# Patient Record
Sex: Female | Born: 1960 | Race: White | Hispanic: No | Marital: Married | Smoking: Never smoker
Health system: Southern US, Community
[De-identification: ages and names within clinical notes are randomized; demographics above are authoritative.]

## PROBLEM LIST (undated history)

## (undated) DIAGNOSIS — R002 Palpitations: Secondary | ICD-10-CM

## (undated) DIAGNOSIS — M1990 Unspecified osteoarthritis, unspecified site: Secondary | ICD-10-CM

## (undated) DIAGNOSIS — F32A Depression, unspecified: Secondary | ICD-10-CM

## (undated) DIAGNOSIS — G4733 Obstructive sleep apnea (adult) (pediatric): Secondary | ICD-10-CM

## (undated) DIAGNOSIS — E785 Hyperlipidemia, unspecified: Secondary | ICD-10-CM

## (undated) DIAGNOSIS — I471 Supraventricular tachycardia, unspecified: Secondary | ICD-10-CM

## (undated) DIAGNOSIS — G5600 Carpal tunnel syndrome, unspecified upper limb: Secondary | ICD-10-CM

## (undated) DIAGNOSIS — G8929 Other chronic pain: Secondary | ICD-10-CM

## (undated) DIAGNOSIS — E119 Type 2 diabetes mellitus without complications: Secondary | ICD-10-CM

## (undated) DIAGNOSIS — I10 Essential (primary) hypertension: Secondary | ICD-10-CM

## (undated) DIAGNOSIS — K219 Gastro-esophageal reflux disease without esophagitis: Secondary | ICD-10-CM

## (undated) DIAGNOSIS — F419 Anxiety disorder, unspecified: Secondary | ICD-10-CM

## (undated) HISTORY — PX: KNEE ARTHROSCOPY: SHX127

## (undated) HISTORY — PX: TUBAL LIGATION: SHX77

## (undated) HISTORY — PX: CATARACT EXTRACTION: SUR2

## (undated) HISTORY — PX: TONSILLECTOMY: SUR1361

## (undated) HISTORY — PX: FOOT SURGERY: SHX648

---

## 2006-09-25 ENCOUNTER — Emergency Department (HOSPITAL_COMMUNITY): Admission: EM | Admit: 2006-09-25 | Discharge: 2006-09-26 | Payer: Self-pay | Admitting: Emergency Medicine

## 2017-08-03 ENCOUNTER — Other Ambulatory Visit: Payer: Self-pay

## 2017-08-03 ENCOUNTER — Emergency Department (HOSPITAL_BASED_OUTPATIENT_CLINIC_OR_DEPARTMENT_OTHER): Payer: BLUE CROSS/BLUE SHIELD

## 2017-08-03 ENCOUNTER — Emergency Department (HOSPITAL_BASED_OUTPATIENT_CLINIC_OR_DEPARTMENT_OTHER)
Admission: EM | Admit: 2017-08-03 | Discharge: 2017-08-03 | Disposition: A | Discharge status: Home / Self Care | Payer: BLUE CROSS/BLUE SHIELD | Attending: Emergency Medicine | Admitting: Emergency Medicine

## 2017-08-03 ENCOUNTER — Encounter (HOSPITAL_BASED_OUTPATIENT_CLINIC_OR_DEPARTMENT_OTHER): Payer: Self-pay | Admitting: *Deleted

## 2017-08-03 DIAGNOSIS — R51 Headache: Secondary | ICD-10-CM | POA: Insufficient documentation

## 2017-08-03 DIAGNOSIS — E119 Type 2 diabetes mellitus without complications: Secondary | ICD-10-CM | POA: Diagnosis not present

## 2017-08-03 DIAGNOSIS — Z79899 Other long term (current) drug therapy: Secondary | ICD-10-CM | POA: Insufficient documentation

## 2017-08-03 DIAGNOSIS — Z7984 Long term (current) use of oral hypoglycemic drugs: Secondary | ICD-10-CM | POA: Insufficient documentation

## 2017-08-03 DIAGNOSIS — I471 Supraventricular tachycardia: Secondary | ICD-10-CM | POA: Diagnosis not present

## 2017-08-03 DIAGNOSIS — I10 Essential (primary) hypertension: Secondary | ICD-10-CM | POA: Insufficient documentation

## 2017-08-03 DIAGNOSIS — R519 Headache, unspecified: Secondary | ICD-10-CM

## 2017-08-03 HISTORY — DX: Essential (primary) hypertension: I10

## 2017-08-03 HISTORY — DX: Obstructive sleep apnea (adult) (pediatric): G47.33

## 2017-08-03 HISTORY — DX: Gastro-esophageal reflux disease without esophagitis: K21.9

## 2017-08-03 HISTORY — DX: Carpal tunnel syndrome, unspecified upper limb: G56.00

## 2017-08-03 HISTORY — DX: Hyperlipidemia, unspecified: E78.5

## 2017-08-03 HISTORY — DX: Palpitations: R00.2

## 2017-08-03 HISTORY — DX: Type 2 diabetes mellitus without complications: E11.9

## 2017-08-03 HISTORY — DX: Supraventricular tachycardia, unspecified: I47.10

## 2017-08-03 HISTORY — DX: Other chronic pain: G89.29

## 2017-08-03 HISTORY — DX: Supraventricular tachycardia: I47.1

## 2017-08-03 LAB — PROTIME-INR
INR: 0.95
Prothrombin Time: 12.6 seconds (ref 11.4–15.2)

## 2017-08-03 LAB — CBC WITH DIFFERENTIAL/PLATELET
BASOS ABS: 0 10*3/uL (ref 0.0–0.1)
BASOS PCT: 0 %
Eosinophils Absolute: 0.2 10*3/uL (ref 0.0–0.7)
Eosinophils Relative: 3 %
HEMATOCRIT: 42.1 % (ref 36.0–46.0)
HEMOGLOBIN: 14.4 g/dL (ref 12.0–15.0)
Lymphocytes Relative: 52 %
Lymphs Abs: 3.8 10*3/uL (ref 0.7–4.0)
MCH: 30.6 pg (ref 26.0–34.0)
MCHC: 34.2 g/dL (ref 30.0–36.0)
MCV: 89.6 fL (ref 78.0–100.0)
MONO ABS: 0.8 10*3/uL (ref 0.1–1.0)
Monocytes Relative: 11 %
NEUTROS ABS: 2.5 10*3/uL (ref 1.7–7.7)
NEUTROS PCT: 34 %
Platelets: 211 10*3/uL (ref 150–400)
RBC: 4.7 MIL/uL (ref 3.87–5.11)
RDW: 12.9 % (ref 11.5–15.5)
WBC: 7.3 10*3/uL (ref 4.0–10.5)

## 2017-08-03 LAB — COMPREHENSIVE METABOLIC PANEL
ALBUMIN: 4 g/dL (ref 3.5–5.0)
ALT: 25 U/L (ref 14–54)
AST: 25 U/L (ref 15–41)
Alkaline Phosphatase: 71 U/L (ref 38–126)
Anion gap: 8 (ref 5–15)
BILIRUBIN TOTAL: 0.6 mg/dL (ref 0.3–1.2)
BUN: 14 mg/dL (ref 6–20)
CO2: 27 mmol/L (ref 22–32)
Calcium: 9.5 mg/dL (ref 8.9–10.3)
Chloride: 103 mmol/L (ref 101–111)
Creatinine, Ser: 0.71 mg/dL (ref 0.44–1.00)
GFR calc Af Amer: >60 mL/min (ref >60)
GFR calc non Af Amer: >60 mL/min (ref >60)
GLUCOSE: 86 mg/dL (ref 65–99)
POTASSIUM: 4.1 mmol/L (ref 3.5–5.1)
Sodium: 138 mmol/L (ref 135–145)
TOTAL PROTEIN: 7.8 g/dL (ref 6.5–8.1)

## 2017-08-03 LAB — URINALYSIS, ROUTINE W REFLEX MICROSCOPIC
Bilirubin Urine: NEGATIVE
Glucose, UA: NEGATIVE mg/dL
HGB URINE DIPSTICK: NEGATIVE
Ketones, ur: NEGATIVE mg/dL
Leukocytes, UA: NEGATIVE
Nitrite: NEGATIVE
PROTEIN: NEGATIVE mg/dL
SPECIFIC GRAVITY, URINE: 1.02 (ref 1.005–1.030)
pH: 5.5 (ref 5.0–8.0)

## 2017-08-03 MED ORDER — SODIUM CHLORIDE 0.9 % IV BOLUS (SEPSIS)
1000.0000 mL | Freq: Once | INTRAVENOUS | Status: AC
  Administered 2017-08-03: 1000 mL via INTRAVENOUS

## 2017-08-03 MED ORDER — PROCHLORPERAZINE MALEATE 10 MG PO TABS: 10.0000 mg | ORAL_TABLET | Freq: Three times a day (TID) | ORAL | 0 refills | Status: DC | PRN

## 2017-08-03 MED ORDER — PROCHLORPERAZINE EDISYLATE 5 MG/ML IJ SOLN
10.0000 mg | Freq: Once | INTRAMUSCULAR | Status: AC
  Administered 2017-08-03: 10 mg via INTRAVENOUS
  Filled 2017-08-03: qty 2

## 2017-08-03 MED ORDER — DIPHENHYDRAMINE HCL 50 MG/ML IJ SOLN
25.0000 mg | Freq: Once | INTRAMUSCULAR | Status: AC
  Administered 2017-08-03: 25 mg via INTRAVENOUS
  Filled 2017-08-03: qty 1

## 2017-08-03 NOTE — ED Provider Notes (Signed)
[facility] EMERGENCY DEPARTMENT Provider Note   CSN: [id number] Arrival date & time: 08/03/17  1448     History   Chief Complaint Chief Complaint  Patient presents with  . Headache    HPI Allesha Aronoff is a 57 y.o. female.  The history is provided by the patient.  Headache   This is a recurrent problem. The current episode started more than 2 days ago. The problem occurs constantly. The problem has not changed since onset.The headache is associated with bright light. The pain is located in the temporal region. The pain is at a severity of 7/10. The pain is moderate. Associated symptoms include nausea. Pertinent negatives include no fever, no chest pressure, no near-syncope, no orthopnea, no palpitations, no syncope, no shortness of breath and no vomiting. She has tried nothing for the symptoms. The treatment provided no relief.    Past Medical History:  Diagnosis Date  . Carpal tunnel syndrome   . Chronic pain   . Diabetes mellitus without complication (HCC)   . GERD (gastroesophageal reflux disease)   . Hyperlipidemia   . Hypertension   . OSA (obstructive sleep apnea)   . Palpitation   . SVT (supraventricular tachycardia) (HCC)     There are no active problems to display for this patient.   Past Surgical History:  Procedure Laterality Date  . KNEE ARTHROSCOPY    . TONSILLECTOMY    . TUBAL LIGATION      OB History    No data available       Home Medications    Prior to Admission medications   Medication Sig Start Date End Date Taking? Authorizing Provider  lisinopril (PRINIVIL,ZESTRIL) 10 MG tablet Take 10 mg by mouth daily.   Yes [provider]  meloxicam (MOBIC) 15 MG tablet Take 15 mg by mouth daily.   Yes [provider]  metFORMIN (GLUCOPHAGE-XR) 500 MG 24 hr tablet Take 500 mg by mouth daily with breakfast.   Yes [provider]  oxybutynin (DITROPAN) 5 MG tablet Take 5 mg by mouth 3 (three) times daily.   Yes  [provider]  oxyCODONE-acetaminophen (PERCOCET/ROXICET) 5-325 MG tablet Take by mouth every 4 (four) hours as needed for severe pain.   Yes [provider]  pregabalin (LYRICA) 150 MG capsule Take 150 mg by mouth 2 (two) times daily.   Yes [provider]  simvastatin (ZOCOR) 20 MG tablet Take 20 mg by mouth daily.   Yes [provider]    Family History No family history on file.  Social History Social History   Tobacco Use  . Smoking status: Not on file  Substance Use Topics  . Alcohol use: Not on file  . Drug use: Not on file     Allergies   Patient has no known allergies.   Review of Systems Review of Systems  Constitutional: Negative for chills, diaphoresis, fatigue and fever.  HENT: Negative for congestion and sneezing.   Eyes: Negative for visual disturbance.  Respiratory: Negative for cough, chest tightness, shortness of breath, wheezing and stridor.   Cardiovascular: Negative for chest pain, palpitations, orthopnea, leg swelling, syncope and near-syncope.  Gastrointestinal: Positive for diarrhea and nausea. Negative for abdominal pain, constipation and vomiting.  Genitourinary: Negative for dysuria, flank pain and frequency.  Musculoskeletal: Negative for back pain, neck pain and neck stiffness.  Skin: Negative for rash and wound.  Neurological: Positive for headaches. Negative for tremors, syncope, speech difficulty, weakness, light-headedness and numbness.  Psychiatric/Behavioral: Negative for agitation.     Physical Exam Updated Vital Signs BP 134/84 (BP Location: Left Arm)   Pulse 64   Temp 98.4 F (36.9 C) (Oral)   Resp 20   Ht 5\' 3"  (1.6 m)   Wt 113.4 kg (250 lb)   SpO2 98%   BMI 44.29 kg/m   Physical Exam  Constitutional: She is oriented to person, place, and time. She appears well-developed and well-nourished.  Non-toxic appearance. She does not appear ill. No distress.  HENT:  Head: Normocephalic and  atraumatic.  Nose: Nose normal.  Mouth/Throat: Oropharynx is clear and moist. No oropharyngeal exudate.  Eyes: Conjunctivae and EOM are normal. Pupils are equal, round, and reactive to light.  Neck: Normal range of motion. Neck supple.  Cardiovascular: Normal rate, regular rhythm and intact distal pulses.  No murmur heard. Pulmonary/Chest: Effort normal and breath sounds normal. No stridor. No respiratory distress. She has no wheezes. She exhibits no tenderness.  Abdominal: Bowel sounds are normal. She exhibits no distension and no mass. There is no tenderness.  Musculoskeletal: Normal range of motion. She exhibits no edema or tenderness.  Lymphadenopathy:    She has no cervical adenopathy.  Neurological: She is alert and oriented to person, place, and time. She is not disoriented. She displays no tremor. No cranial nerve deficit or sensory deficit. She exhibits normal muscle tone. Coordination and gait normal. GCS eye subscore is 4. GCS verbal subscore is 5. GCS motor subscore is 6.  Skin: Capillary refill takes less than 2 seconds. No rash noted. She is not diaphoretic. No erythema.  Psychiatric: She has a normal mood and affect.  Nursing note and vitals reviewed.    ED Treatments / Results  Labs (all labs ordered are listed, but only abnormal results are displayed) Labs Reviewed  URINE CULTURE  CBC WITH DIFFERENTIAL/PLATELET  COMPREHENSIVE METABOLIC PANEL  URINALYSIS, ROUTINE W REFLEX MICROSCOPIC  PROTIME-INR    EKG  EKG Interpretation None       Radiology Ct Head Wo Contrast  Result Date: 08/03/2017 CLINICAL DATA:  RIGHT-sided headache and dizziness since this morning, history hypertension EXAM: CT HEAD WITHOUT CONTRAST TECHNIQUE: Contiguous axial images were obtained from the base of the skull through the vertex without intravenous contrast. Sagittal and coronal MPR images reconstructed from axial data set. COMPARISON:  None FINDINGS: Brain: Normal ventricular morphology.  No midline shift or mass effect. Normal appearance of brain parenchyma. No intracranial hemorrhage, mass lesion, evidence of acute infarction, or extra-axial fluid collection. Vascular: Unremarkable Skull: Intact Sinuses/Orbits: Clear Other: N/A IMPRESSION: Normal exam. Electronically Signed   By: Ulyses Southward M.D.   On: 08/03/2017 17:28    Procedures Procedures (including critical care time)  Medications Ordered in ED Medications  sodium chloride 0.9 % bolus 1,000 mL (0 mLs Intravenous Stopped 08/03/17 1924)  prochlorperazine (COMPAZINE) injection 10 mg (10 mg Intravenous Given 08/03/17 1747)  diphenhydrAMINE (BENADRYL) injection 25 mg (25 mg Intravenous Given 08/03/17 1747)     Initial Impression / Assessment and Plan / ED Course  I have reviewed the triage vital signs and the nursing notes.  Pertinent labs & imaging results that were available during my care of the patient were reviewed by me and considered in my medical decision making (see chart for details).     Rehema Muffley is a 57 y.o. female with a past medical history significant for migraines, diabetes, GERD, sleep apnea, hypertension, hyperlipidemia, and recent diagnosis of right finger infection on doxycycline who presents  with headache, lightheadedness, and intermittent diarrhea.  Patient reports that for the last few days she has been having headaches.  She reports that headaches are currently a 7 out of 10 severity and in her bilateral temporal areas.  She reports of being as bad as a 9 out of 10.  She does report feeling fatigued and having some intermittent diarrhea.  There is no hematemesis, nausea, vomiting, or blood in the bowel movement.  She denies any urinary symptoms.  She reports that she has been having some confusion and feeling off with the headaches.  She reports she is a headaches in the past but this feels slightly different.  She does reports intense photophobia with the headaches.  She reports that she scraped  her right pinky finger last week with a credit card and is currently on doxycycline for this.  She denies worsened pain, swelling, or redness streaking up the arm.  She denies fevers or chills.  She denies other complaints.  On exam, patient had no focal neurologic deficits.  Patient's lungs were clear bilaterally.  Chest and abdomen were nontender.  Patient had no significant abnormality is on hand exam is normal pulses, sensation, and strength.  Mild tenderness near the site of the infection.  No evidence of septic joint or spreading infection.  Patient had no neck tenderness or neck stiffness.  No other abnormal is on exam seen.  Next  Suspect headache due to migraine in the setting of dehydration with her recent diarrhea and infection however due to the reported confusion headaches, patient had imaging performed as well as lab testing.  Patient had no significant elective abnormality seen and no evidence of worsened infection in the hand.  CT head was normal.  Patient was given a headache cocktail with near complete resolution of her headache.  Patient was feeling much better and no longer confused.  Given improvement in symptoms, suspect patient is safe for discharge home.  Patient will call her PCP tomorrow for follow-up in several days.  Patient will be given prescription for Compazine as this was included in the cocktail helped with her symptoms.  Patient understood return precautions for any new or worsened symptoms.  Patient and family had no other questions or concerns and patient was discharged in good condition.   Final Clinical Impressions(s) / ED Diagnoses   Final diagnoses:  Acute nonintractable headache, unspecified headache type    ED Discharge Orders        Ordered    prochlorperazine (COMPAZINE) 10 MG tablet  Every 8 hours PRN     08/03/17 2133      Clinical Impression: 1. Acute nonintractable headache, unspecified headache type     Disposition: Discharge  Condition:  Good  I have discussed the results, Dx and Tx plan with the pt(& family if present). He/she/they expressed understanding and agree(s) with the plan. Discharge instructions discussed at great length. Strict return precautions discussed and pt &/or family have verbalized understanding of the instructions. No further questions at time of discharge.    New Prescriptions   PROCHLORPERAZINE (COMPAZINE) 10 MG TABLET    Take 1 tablet (10 mg total) by mouth every 8 (eight) hours as needed for nausea or vomiting.    Follow Up: Eather ColasHunter, Megan A, FNP [address] [place] [id number] [phone]     [facility] [address] [id number] [address]c [address] [id number] [phone]        Alisan Dokes,  Canary Brim, MD 08/04/17 [phone]

## 2017-08-03 NOTE — Discharge Instructions (Signed)
Your workup today showed no evidence of acute intracranial abnormality.  We did not find evidence of serious infection.  We suspect you are dehydrated in the setting of the diarrhea and your finger infection.  Please stay hydrated.  Please use the headache medicine to help with headache and nausea.  Please follow-up with your primary doctor in several days.  If any symptoms change or worsen, please return to the nearest emergency department.

## 2017-08-03 NOTE — ED Triage Notes (Signed)
Pt h/a and dizziness  x 3 days sent here from [facility] office for eval.

## 2017-08-04 LAB — URINE CULTURE

## 2019-05-19 DIAGNOSIS — E119 Type 2 diabetes mellitus without complications: Secondary | ICD-10-CM

## 2019-05-19 DIAGNOSIS — E860 Dehydration: Secondary | ICD-10-CM

## 2019-05-19 DIAGNOSIS — R319 Hematuria, unspecified: Secondary | ICD-10-CM

## 2019-05-19 DIAGNOSIS — R0902 Hypoxemia: Secondary | ICD-10-CM

## 2019-05-19 DIAGNOSIS — R4182 Altered mental status, unspecified: Secondary | ICD-10-CM

## 2019-05-19 DIAGNOSIS — N390 Urinary tract infection, site not specified: Secondary | ICD-10-CM

## 2019-05-19 DIAGNOSIS — G4733 Obstructive sleep apnea (adult) (pediatric): Secondary | ICD-10-CM

## 2019-05-19 DIAGNOSIS — I10 Essential (primary) hypertension: Secondary | ICD-10-CM

## 2019-05-19 DIAGNOSIS — E785 Hyperlipidemia, unspecified: Secondary | ICD-10-CM

## 2019-05-20 DIAGNOSIS — E119 Type 2 diabetes mellitus without complications: Secondary | ICD-10-CM | POA: Diagnosis not present

## 2019-05-20 DIAGNOSIS — N390 Urinary tract infection, site not specified: Secondary | ICD-10-CM | POA: Diagnosis not present

## 2019-05-20 DIAGNOSIS — I10 Essential (primary) hypertension: Secondary | ICD-10-CM | POA: Diagnosis not present

## 2019-05-20 DIAGNOSIS — E860 Dehydration: Secondary | ICD-10-CM | POA: Diagnosis not present

## 2019-05-21 DIAGNOSIS — I10 Essential (primary) hypertension: Secondary | ICD-10-CM | POA: Diagnosis not present

## 2019-05-21 DIAGNOSIS — E119 Type 2 diabetes mellitus without complications: Secondary | ICD-10-CM | POA: Diagnosis not present

## 2019-05-21 DIAGNOSIS — N390 Urinary tract infection, site not specified: Secondary | ICD-10-CM | POA: Diagnosis not present

## 2019-05-21 DIAGNOSIS — E860 Dehydration: Secondary | ICD-10-CM | POA: Diagnosis not present

## 2019-05-22 DIAGNOSIS — E860 Dehydration: Secondary | ICD-10-CM | POA: Diagnosis not present

## 2019-05-22 DIAGNOSIS — N390 Urinary tract infection, site not specified: Secondary | ICD-10-CM | POA: Diagnosis not present

## 2019-05-22 DIAGNOSIS — E119 Type 2 diabetes mellitus without complications: Secondary | ICD-10-CM | POA: Diagnosis not present

## 2019-05-22 DIAGNOSIS — I10 Essential (primary) hypertension: Secondary | ICD-10-CM | POA: Diagnosis not present

## 2019-05-23 DIAGNOSIS — I10 Essential (primary) hypertension: Secondary | ICD-10-CM | POA: Diagnosis not present

## 2019-05-23 DIAGNOSIS — N390 Urinary tract infection, site not specified: Secondary | ICD-10-CM | POA: Diagnosis not present

## 2019-05-23 DIAGNOSIS — E119 Type 2 diabetes mellitus without complications: Secondary | ICD-10-CM | POA: Diagnosis not present

## 2019-05-23 DIAGNOSIS — E860 Dehydration: Secondary | ICD-10-CM | POA: Diagnosis not present

## 2019-09-19 ENCOUNTER — Emergency Department (HOSPITAL_COMMUNITY): Payer: BC Managed Care – PPO

## 2019-09-19 ENCOUNTER — Inpatient Hospital Stay (HOSPITAL_COMMUNITY)
Admission: EM | Admit: 2019-09-19 | Discharge: 2019-09-22 | DRG: 092 | Disposition: A | Discharge status: Home / Self Care | Payer: BC Managed Care – PPO | Attending: Family Medicine | Admitting: Family Medicine

## 2019-09-19 ENCOUNTER — Encounter (HOSPITAL_COMMUNITY): Payer: Self-pay | Admitting: Emergency Medicine

## 2019-09-19 DIAGNOSIS — T426X5A Adverse effect of other antiepileptic and sedative-hypnotic drugs, initial encounter: Secondary | ICD-10-CM | POA: Diagnosis present

## 2019-09-19 DIAGNOSIS — R4182 Altered mental status, unspecified: Secondary | ICD-10-CM | POA: Diagnosis not present

## 2019-09-19 DIAGNOSIS — G8929 Other chronic pain: Secondary | ICD-10-CM | POA: Diagnosis present

## 2019-09-19 DIAGNOSIS — G4733 Obstructive sleep apnea (adult) (pediatric): Secondary | ICD-10-CM | POA: Diagnosis present

## 2019-09-19 DIAGNOSIS — N390 Urinary tract infection, site not specified: Secondary | ICD-10-CM | POA: Diagnosis present

## 2019-09-19 DIAGNOSIS — G92 Toxic encephalopathy: Principal | ICD-10-CM | POA: Diagnosis present

## 2019-09-19 DIAGNOSIS — R410 Disorientation, unspecified: Secondary | ICD-10-CM

## 2019-09-19 DIAGNOSIS — I10 Essential (primary) hypertension: Secondary | ICD-10-CM | POA: Diagnosis present

## 2019-09-19 DIAGNOSIS — G9341 Metabolic encephalopathy: Secondary | ICD-10-CM | POA: Diagnosis present

## 2019-09-19 DIAGNOSIS — E119 Type 2 diabetes mellitus without complications: Secondary | ICD-10-CM | POA: Diagnosis present

## 2019-09-19 DIAGNOSIS — Z20822 Contact with and (suspected) exposure to covid-19: Secondary | ICD-10-CM | POA: Diagnosis present

## 2019-09-19 DIAGNOSIS — K219 Gastro-esophageal reflux disease without esophagitis: Secondary | ICD-10-CM | POA: Diagnosis present

## 2019-09-19 DIAGNOSIS — Z7984 Long term (current) use of oral hypoglycemic drugs: Secondary | ICD-10-CM

## 2019-09-19 DIAGNOSIS — E785 Hyperlipidemia, unspecified: Secondary | ICD-10-CM | POA: Diagnosis present

## 2019-09-19 LAB — COMPREHENSIVE METABOLIC PANEL
ALT: 14 U/L (ref 0–44)
AST: 11 U/L — ABNORMAL LOW (ref 15–41)
Albumin: 3 g/dL — ABNORMAL LOW (ref 3.5–5.0)
Alkaline Phosphatase: 78 U/L (ref 38–126)
Anion gap: 9 (ref 5–15)
BUN: 11 mg/dL (ref 6–20)
CO2: 23 mmol/L (ref 22–32)
Calcium: 8.7 mg/dL — ABNORMAL LOW (ref 8.9–10.3)
Chloride: 101 mmol/L (ref 98–111)
Creatinine, Ser: 0.93 mg/dL (ref 0.44–1.00)
GFR calc Af Amer: >60 mL/min (ref >60)
GFR calc non Af Amer: >60 mL/min (ref >60)
Glucose, Bld: 134 mg/dL — ABNORMAL HIGH (ref 70–99)
Potassium: 3.9 mmol/L (ref 3.5–5.1)
Sodium: 133 mmol/L — ABNORMAL LOW (ref 135–145)
Total Bilirubin: 1.1 mg/dL (ref 0.3–1.2)
Total Protein: 6.7 g/dL (ref 6.5–8.1)

## 2019-09-19 LAB — URINALYSIS, ROUTINE W REFLEX MICROSCOPIC
Bilirubin Urine: NEGATIVE
Glucose, UA: NEGATIVE mg/dL
Hgb urine dipstick: NEGATIVE
Ketones, ur: 5 mg/dL — AB
Leukocytes,Ua: NEGATIVE
Nitrite: NEGATIVE
Protein, ur: NEGATIVE mg/dL
Specific Gravity, Urine: 1.012 (ref 1.005–1.030)
pH: 5 (ref 5.0–8.0)

## 2019-09-19 LAB — CBC WITH DIFFERENTIAL/PLATELET
Abs Immature Granulocytes: 0.08 10*3/uL — ABNORMAL HIGH (ref 0.00–0.07)
Basophils Absolute: 0.1 10*3/uL (ref 0.0–0.1)
Basophils Relative: 0 %
Eosinophils Absolute: 0 10*3/uL (ref 0.0–0.5)
Eosinophils Relative: 0 %
HCT: 41.2 % (ref 36.0–46.0)
Hemoglobin: 13.5 g/dL (ref 12.0–15.0)
Immature Granulocytes: 0 %
Lymphocytes Relative: 14 %
Lymphs Abs: 2.8 10*3/uL (ref 0.7–4.0)
MCH: 29.8 pg (ref 26.0–34.0)
MCHC: 32.8 g/dL (ref 30.0–36.0)
MCV: 90.9 fL (ref 80.0–100.0)
Monocytes Absolute: 1.6 10*3/uL — ABNORMAL HIGH (ref 0.1–1.0)
Monocytes Relative: 8 %
Neutro Abs: 14.7 10*3/uL — ABNORMAL HIGH (ref 1.7–7.7)
Neutrophils Relative %: 78 %
Platelets: 201 10*3/uL (ref 150–400)
RBC: 4.53 MIL/uL (ref 3.87–5.11)
RDW: 13.2 % (ref 11.5–15.5)
WBC: 19.2 10*3/uL — ABNORMAL HIGH (ref 4.0–10.5)
nRBC: 0 % (ref 0.0–0.2)

## 2019-09-19 LAB — MAGNESIUM: Magnesium: 2.1 mg/dL (ref 1.7–2.4)

## 2019-09-19 LAB — RAPID URINE DRUG SCREEN, HOSP PERFORMED
Amphetamines: NOT DETECTED
Barbiturates: NOT DETECTED
Benzodiazepines: NOT DETECTED
Cocaine: NOT DETECTED
Opiates: NOT DETECTED
Tetrahydrocannabinol: NOT DETECTED

## 2019-09-19 LAB — SARS CORONAVIRUS 2 (TAT 6-24 HRS): SARS Coronavirus 2: NEGATIVE

## 2019-09-19 MED ORDER — LORAZEPAM 1 MG PO TABS
1.0000 mg | ORAL_TABLET | Freq: Once | ORAL | Status: AC
  Administered 2019-09-19: 1 mg via ORAL
  Filled 2019-09-19: qty 1

## 2019-09-19 MED ORDER — SODIUM CHLORIDE 0.9 % IV BOLUS
1000.0000 mL | Freq: Once | INTRAVENOUS | Status: AC
  Administered 2019-09-19: 1000 mL via INTRAVENOUS

## 2019-09-19 MED ORDER — LACTATED RINGERS IV BOLUS
1000.0000 mL | Freq: Once | INTRAVENOUS | Status: AC
  Administered 2019-09-20: 1000 mL via INTRAVENOUS

## 2019-09-19 NOTE — ED Notes (Signed)
Pt only oriented to self, when asked questions pt keeps repeating the spelling of her last name, disoriented to place, time, and situation. Follows commands.

## 2019-09-19 NOTE — ED Provider Notes (Addendum)
[facility] EMERGENCY DEPARTMENT Provider Note   CSN: [id number] Arrival date & time: 09/19/19  1534     History Chief Complaint  Patient presents with  . Altered Mental Status    Judy Downs is a 59 y.o. female.  Level 5 caveat for altered mental status.  Most of history obtained from husband.  Patient has been acting altered for approximately 24 hours.  Similar event in November 2020.  She has had a UTI in the past with similar symptoms.  Review of systems positive for poor oral intake, fatigue.  Past medical history includes chronic pain, diabetes, hypertension, hyperlipidemia, sleep apnea, SVT.        Past Medical History:  Diagnosis Date  . Carpal tunnel syndrome   . Chronic pain   . Diabetes mellitus without complication (HCC)   . GERD (gastroesophageal reflux disease)   . Hyperlipidemia   . Hypertension   . OSA (obstructive sleep apnea)   . Palpitation   . SVT (supraventricular tachycardia) (HCC)     There are no problems to display for this patient.   Past Surgical History:  Procedure Laterality Date  . KNEE ARTHROSCOPY    . TONSILLECTOMY    . TUBAL LIGATION       OB History   No obstetric history on file.     History reviewed. No pertinent family history.  Social History   Tobacco Use  . Smoking status: Not on file  Substance Use Topics  . Alcohol use: Not on file  . Drug use: Not on file    Home Medications Prior to Admission medications   Medication Sig Start Date End Date Taking? Authorizing Provider  cyclobenzaprine (FLEXERIL) 10 MG tablet Take 10 mg by mouth 3 (three) times daily as needed for muscle spasms.  09/09/19  Yes [provider]  DULoxetine (CYMBALTA) 60 MG capsule Take 60 mg by mouth at bedtime. 08/12/19  Yes [provider]  Magnesium 500 MG TABS Take 1 tablet by mouth every evening.   Yes [provider]  metFORMIN (GLUCOPHAGE) 500 MG tablet Take 500 mg by mouth 2 (two) times  daily. 07/01/19  Yes [provider]  nitrofurantoin (MACRODANTIN) 100 MG capsule Take 100 mg by mouth daily. 09/04/19  Yes [provider]  pregabalin (LYRICA) 150 MG capsule Take 150 mg by mouth 2 (two) times daily.   Yes [provider]  simvastatin (ZOCOR) 20 MG tablet Take 20 mg by mouth daily.   Yes [provider]  solifenacin (VESICARE) 10 MG tablet Take 10 mg by mouth at bedtime. 08/21/19  Yes [provider]  prochlorperazine (COMPAZINE) 10 MG tablet Take 1 tablet (10 mg total) by mouth every 8 (eight) hours as needed for nausea or vomiting. Patient not taking: Reported on 09/19/2019 08/03/17   Tegeler, Canary Brim, MD    Allergies    Patient has no known allergies.  Review of Systems   Review of Systems  All other systems reviewed and are negative.   Physical Exam Updated Vital Signs BP 109/85   Pulse 88   Temp 98.3 F (36.8 C) (Oral)   Resp (!) 22   SpO2 96%   Physical Exam Vitals and nursing note reviewed.  Constitutional:      Appearance: She is well-developed.     Comments: Mumbling answers to questions.  HENT:     Head: Normocephalic and atraumatic.  Eyes:     Conjunctiva/sclera: Conjunctivae normal.  Cardiovascular:  Rate and Rhythm: Normal rate and regular rhythm.  Pulmonary:     Effort: Pulmonary effort is normal.     Breath sounds: Normal breath sounds.  Abdominal:     General: Bowel sounds are normal.     Palpations: Abdomen is soft.  Musculoskeletal:        General: Normal range of motion.     Cervical back: Neck supple.  Skin:    General: Skin is warm and dry.  Neurological:     Comments: Moving all 4 extremities to command.  Psychiatric:     Comments: Confused     ED Results / Procedures / Treatments   Labs (all labs ordered are listed, but only abnormal results are displayed) Labs Reviewed  CBC WITH DIFFERENTIAL/PLATELET - Abnormal; Notable for the following components:      Result Value     WBC 19.2 (*)    Neutro Abs 14.7 (*)    Monocytes Absolute 1.6 (*)    Abs Immature Granulocytes 0.08 (*)    All other components within normal limits  COMPREHENSIVE METABOLIC PANEL - Abnormal; Notable for the following components:   Sodium 133 (*)    Glucose, Bld 134 (*)    Calcium 8.7 (*)    Albumin 3.0 (*)    AST 11 (*)    All other components within normal limits  URINALYSIS, ROUTINE W REFLEX MICROSCOPIC - Abnormal; Notable for the following components:   Ketones, ur 5 (*)    All other components within normal limits  SARS CORONAVIRUS 2 (TAT 6-24 HRS)  RAPID URINE DRUG SCREEN, HOSP PERFORMED  MAGNESIUM    EKG None  Radiology CT Head Wo Contrast  Result Date: 09/19/2019 CLINICAL DATA:  Altered mental status; neuro deficit, subacute. Additional history provided: Altered mental status, decreased intake, increased fatigue. EXAM: CT HEAD WITHOUT CONTRAST TECHNIQUE: Contiguous axial images were obtained from the base of the skull through the vertex without intravenous contrast. COMPARISON:  Brain MRI 05/20/2019, head CT 05/18/2021 FINDINGS: Brain: There is no evidence of acute intracranial hemorrhage, intracranial mass, midline shift or extra-axial fluid collection.No demarcated cortical infarction. Cerebral volume is normal. Vascular: No hyperdense vessel. Atherosclerotic calcifications. Skull: Normal. Negative for fracture or focal lesion. Sinuses/Orbits: Visualized orbits demonstrate no acute abnormality. Mild ethmoid sinus mucosal thickening. No significant mastoid effusion. IMPRESSION: No evidence of acute intracranial abnormality. Mild ethmoid sinus mucosal thickening. Electronically Signed   By: Jackey Loge DO   On: 09/19/2019 19:20   DG Chest Port 1 View  Result Date: 09/19/2019 CLINICAL DATA:  Altered mental status EXAM: PORTABLE CHEST 1 VIEW COMPARISON:  May 19, 2019 FINDINGS: The cardiac silhouette Mains enlarged. Again noted are aortic calcifications. There is vascular  congestion without overt pulmonary edema. There is no pneumothorax. No large pleural effusion. There is no acute osseous abnormality. IMPRESSION: Cardiomegaly and vascular congestion without overt pulmonary edema. Electronically Signed   By: Katherine Mantle M.D.   On: 09/19/2019 17:55    Procedures Procedures (including critical care time)  Medications Ordered in ED Medications  sodium chloride 0.9 % bolus 1,000 mL (1,000 mLs Intravenous New Bag/Given 09/19/19 2134)  LORazepam (ATIVAN) tablet 1 mg (1 mg Oral Given 09/19/19 2247)    ED Course  I have reviewed the triage vital signs and the nursing notes.  Pertinent labs & imaging results that were available during my care of the patient were reviewed by me and considered in my medical decision making (see chart for details).    MDM  Rules/Calculators/A&P                      Uncertain etiology of patient's altered mental status.  Will initiate basic work-up including UA, CT head, urine drug screen  2130: Discussed with hospitalist Dr. Darrick Meigs.  He recommended MRI of brain Final Clinical Impression(s) / ED Diagnoses Final diagnoses:  Altered mental status, unspecified altered mental status type    Rx / DC Orders ED Discharge Orders    None       Nat Christen, MD 09/19/19 1707    Nat Christen, MD 09/19/19 [phone]

## 2019-09-19 NOTE — ED Notes (Signed)
Pt ambulated in room w/ RN, pt unsteady w/ cane. Pt stopped multiple time to catch breath and endorsed pain in back and abdomen.

## 2019-09-19 NOTE — ED Notes (Signed)
RN performed in and out cath to obtain urine sample. RN emptied pt's bladder, total of 350 ml of urine output.

## 2019-09-19 NOTE — ED Notes (Signed)
Pt transported to MRI 

## 2019-09-19 NOTE — ED Triage Notes (Addendum)
Pt here from home via [facility] for ams, decreased intake, increased fatigue. Pt went to PCP w/ husband, they sent her here for UTI eval, pt c/o frequent urination. Pt baseline is aox4, but now only alert to self. LKW was Saturday at 2000. VSS

## 2019-09-19 NOTE — ED Provider Notes (Signed)
11:11 PM Assumed care from Dr. Adriana Simas, please see their note for full history, physical and decision making until this point. In brief this is a 59 y.o. year old female who presented to the ED tonight with Altered Mental Status     AMS of unclear etiology. Likely needs admission. Pending MRI. Dr. Shawn Stall admitting.   MRI is negative for any acute findings.  On my evaluation patient still seems confused.  For example the nurse states that she had chest pain approximately 10 minutes prior to my evaluation that was fleeting in nature.  When asking the patient about it she does not remember at all.  I asked her husband is to get more information and she states she does not know where he would be.  Patient states that she felt fine today but she cannot recall any of the events from earlier.  Discussed with Dr. Sharl Ma about possible medication effect versus other etiologies.  We will plan for observation the hospital with further work-up as indicated.   Labs, studies and imaging reviewed by myself and considered in medical decision making if ordered. Imaging interpreted by radiology.  Labs Reviewed  CBC WITH DIFFERENTIAL/PLATELET - Abnormal; Notable for the following components:      Result Value   WBC 19.2 (*)    Neutro Abs 14.7 (*)    Monocytes Absolute 1.6 (*)    Abs Immature Granulocytes 0.08 (*)    All other components within normal limits  COMPREHENSIVE METABOLIC PANEL - Abnormal; Notable for the following components:   Sodium 133 (*)    Glucose, Bld 134 (*)    Calcium 8.7 (*)    Albumin 3.0 (*)    AST 11 (*)    All other components within normal limits  URINALYSIS, ROUTINE W REFLEX MICROSCOPIC - Abnormal; Notable for the following components:   Ketones, ur 5 (*)    All other components within normal limits  SARS CORONAVIRUS 2 (TAT 6-24 HRS)  RAPID URINE DRUG SCREEN, HOSP PERFORMED  MAGNESIUM    CT Head Wo Contrast  Final Result    DG Chest Port 1 View  Final Result    MR BRAIN WO  CONTRAST    (Results Pending)    No follow-ups on file.    Judy Downs, Judy Cower, MD 09/20/19 [phone]

## 2019-09-20 DIAGNOSIS — E119 Type 2 diabetes mellitus without complications: Secondary | ICD-10-CM | POA: Diagnosis not present

## 2019-09-20 DIAGNOSIS — R4182 Altered mental status, unspecified: Secondary | ICD-10-CM

## 2019-09-20 LAB — CBC
HCT: 38.4 % (ref 36.0–46.0)
Hemoglobin: 12.5 g/dL (ref 12.0–15.0)
MCH: 29.5 pg (ref 26.0–34.0)
MCHC: 32.6 g/dL (ref 30.0–36.0)
MCV: 90.6 fL (ref 80.0–100.0)
Platelets: 190 10*3/uL (ref 150–400)
RBC: 4.24 MIL/uL (ref 3.87–5.11)
RDW: 13.2 % (ref 11.5–15.5)
WBC: 13.1 10*3/uL — ABNORMAL HIGH (ref 4.0–10.5)
nRBC: 0 % (ref 0.0–0.2)

## 2019-09-20 LAB — COMPREHENSIVE METABOLIC PANEL
ALT: 13 U/L (ref 0–44)
AST: 13 U/L — ABNORMAL LOW (ref 15–41)
Albumin: 2.8 g/dL — ABNORMAL LOW (ref 3.5–5.0)
Alkaline Phosphatase: 87 U/L (ref 38–126)
Anion gap: 11 (ref 5–15)
BUN: 9 mg/dL (ref 6–20)
CO2: 23 mmol/L (ref 22–32)
Calcium: 8.7 mg/dL — ABNORMAL LOW (ref 8.9–10.3)
Chloride: 104 mmol/L (ref 98–111)
Creatinine, Ser: 0.7 mg/dL (ref 0.44–1.00)
GFR calc Af Amer: >60 mL/min (ref >60)
GFR calc non Af Amer: >60 mL/min (ref >60)
Glucose, Bld: 108 mg/dL — ABNORMAL HIGH (ref 70–99)
Potassium: 3.9 mmol/L (ref 3.5–5.1)
Sodium: 138 mmol/L (ref 135–145)
Total Bilirubin: 1.1 mg/dL (ref 0.3–1.2)
Total Protein: 6.4 g/dL — ABNORMAL LOW (ref 6.5–8.1)

## 2019-09-20 LAB — CBG MONITORING, ED
Glucose-Capillary: 103 mg/dL — ABNORMAL HIGH (ref 70–99)
Glucose-Capillary: 99 mg/dL (ref 70–99)
Glucose-Capillary: 106 mg/dL — ABNORMAL HIGH (ref 70–99)

## 2019-09-20 LAB — GLUCOSE, CAPILLARY
Glucose-Capillary: 73 mg/dL (ref 70–99)
Glucose-Capillary: 73 mg/dL (ref 70–99)
Glucose-Capillary: 79 mg/dL (ref 70–99)
Glucose-Capillary: 93 mg/dL (ref 70–99)

## 2019-09-20 LAB — HEMOGLOBIN A1C
Hgb A1c MFr Bld: 6.5 % — ABNORMAL HIGH (ref 4.8–5.6)
Mean Plasma Glucose: 139.85 mg/dL

## 2019-09-20 LAB — HIV ANTIBODY (ROUTINE TESTING W REFLEX): HIV Screen 4th Generation wRfx: NONREACTIVE

## 2019-09-20 MED ORDER — ENOXAPARIN SODIUM 40 MG/0.4ML ~~LOC~~ SOLN
40.0000 mg | SUBCUTANEOUS | Status: DC
  Administered 2019-09-20 – 2019-09-22 (×3): 40 mg via SUBCUTANEOUS
  Filled 2019-09-20 (×3): qty 0.4

## 2019-09-20 MED ORDER — INSULIN ASPART 100 UNIT/ML ~~LOC~~ SOLN
0.0000 [IU] | SUBCUTANEOUS | Status: DC
  Administered 2019-09-22 (×3): 1 [IU] via SUBCUTANEOUS

## 2019-09-20 MED ORDER — ACETAMINOPHEN 325 MG PO TABS
650.0000 mg | ORAL_TABLET | Freq: Four times a day (QID) | ORAL | Status: DC | PRN
  Administered 2019-09-20 – 2019-09-21 (×2): 650 mg via ORAL
  Filled 2019-09-20 (×2): qty 2

## 2019-09-20 MED ORDER — ONDANSETRON HCL 4 MG PO TABS: 4.0000 mg | ORAL_TABLET | Freq: Four times a day (QID) | ORAL | Status: DC | PRN

## 2019-09-20 MED ORDER — SODIUM CHLORIDE 0.9 % IV SOLN: INTRAVENOUS | Status: DC

## 2019-09-20 MED ORDER — ONDANSETRON HCL 4 MG/2ML IJ SOLN: 4.0000 mg | Freq: Four times a day (QID) | INTRAMUSCULAR | Status: DC | PRN

## 2019-09-20 MED ORDER — ACETAMINOPHEN 650 MG RE SUPP: 650.0000 mg | Freq: Four times a day (QID) | RECTAL | Status: DC | PRN

## 2019-09-20 NOTE — Progress Notes (Addendum)
Patient placed in observation after midnight, but care began in the ER prior to midnight. Please see H&P.  Here with confusion/lethergy.  No sign of UTI or other electrolyte derangements so highly suspect this is from polypharmacy/abuse of medications.  RN in ER reported: the husband called and said the last time the patient was hospitalized for AMS they thought she was taking more than prescribed of her lyrica.  Will hold all medications for now and monitor her AMS.   This AM she was still in the hallway and very sleepy and minimally interactive (seems to be by choice- as she says she did not sleep well last night).  But based on review of physical exam on H&P she has slightly improved in mental status but is not yet back to her baseline.  Marlin Canary DO

## 2019-09-20 NOTE — ED Notes (Signed)
Pt's husband david updated on patient's admission bed being ready.

## 2019-09-20 NOTE — ED Notes (Signed)
Hospitalist at bedside 

## 2019-09-20 NOTE — H&P (Signed)
TRH H&P    Patient Demographics:    Judy Downs, is a 59 y.o. female  MRN: [id number]  DOB - Jul 09, 1960  Admit Date - 09/19/2019  Referring MD/NP/PA: Donnetta Hutching  Outpatient Primary MD for the patient is Eather Colas, FNP  Patient coming from: Home  Chief complaint-altered mental status   HPI:    Judy Downs  is a 59 y.o. female, with past medical history of diabetes mellitus type 2, hypertension, hyperlipidemia, sleep apnea, SVT who was brought to hospital for altered mental status for past 24 hours.  Patient had similar event in November 2020.  She has had UTI in the past with similar symptoms.  Patient is unable to provide any significant history.  Husband is not at bedside.  Unable to contact him on the phone. Patient is alert, denies any pain at this time.  Except pain in her left wrist Denies chest pain, no shortness of breath. Denies abdominal pain. No previous history of stroke or seizures.  In the ED CT of the head was unremarkable, MRI brain was unremarkable. Neck was supple on examination, meningitis not suspected. UA was clear.  Chest x-ray unremarkable      Review of systems:    In addition to the HPI above,   All other systems reviewed and are negative.    Past History of the following :    Past Medical History:  Diagnosis Date  . Carpal tunnel syndrome   . Chronic pain   . Diabetes mellitus without complication (HCC)   . GERD (gastroesophageal reflux disease)   . Hyperlipidemia   . Hypertension   . OSA (obstructive sleep apnea)   . Palpitation   . SVT (supraventricular tachycardia) (HCC)       Past Surgical History:  Procedure Laterality Date  . KNEE ARTHROSCOPY    . TONSILLECTOMY    . TUBAL LIGATION        Social History:      Social History   Tobacco Use  . Smoking status: Not on file  Substance Use Topics  . Alcohol use: Not on file       Family History :   Unable to obtain due to patient's confusion   Home Medications:   Prior to Admission medications   Medication Sig Start Date End Date Taking? Authorizing Provider  cyclobenzaprine (FLEXERIL) 10 MG tablet Take 10 mg by mouth 3 (three) times daily as needed for muscle spasms.  09/09/19  Yes [provider]  DULoxetine (CYMBALTA) 60 MG capsule Take 60 mg by mouth at bedtime. 08/12/19  Yes [provider]  Magnesium 500 MG TABS Take 1 tablet by mouth every evening.   Yes [provider]  metFORMIN (GLUCOPHAGE) 500 MG tablet Take 500 mg by mouth 2 (two) times daily. 07/01/19  Yes [provider]  nitrofurantoin (MACRODANTIN) 100 MG capsule Take 100 mg by mouth daily. 09/04/19  Yes [provider]  pregabalin (LYRICA) 150 MG capsule Take 150 mg by mouth 2 (two) times daily.   Yes [provider]  simvastatin (ZOCOR) 20 MG tablet Take 20 mg by mouth daily.   Yes [provider]  solifenacin (VESICARE) 10 MG tablet Take 10 mg by mouth at bedtime. 08/21/19  Yes [provider]  prochlorperazine (COMPAZINE) 10 MG tablet Take 1 tablet (10 mg total) by mouth every 8 (eight) hours as needed for nausea or vomiting. Patient not taking: Reported on 09/19/2019 08/03/17 09/20/19  Tegeler, Gwenyth Allegra, MD     Allergies:    No Known Allergies   Physical Exam:   Vitals  Blood pressure (!) 150/73, pulse 88, temperature 98.3 F (36.8 C), temperature source Oral, resp. rate 20, SpO2 96 %.  1.  General: Appears in no acute distress  2. Psychiatric: Alert, only oriented to self, lacks insight and judgment  3. Neurologic: Cranial nerves II through XII grossly intact, moving all extremities  4. HEENMT:  Atraumatic normocephalic, extraocular muscles are intact, neck is supple  5. Respiratory : Clear to auscultation bilaterally, no wheezing or crackles auscultated  6. Cardiovascular : S1-S2, regular, no murmur  auscultated, no edema noted in the lower extremities  7. Gastrointestinal:  Abdomen is soft, nontender, no organomegaly      Data Review:    CBC Recent Labs  Lab 09/19/19 1743  WBC 19.2*  HGB 13.5  HCT 41.2  PLT 201  MCV 90.9  MCH 29.8  MCHC 32.8  RDW 13.2  LYMPHSABS 2.8  MONOABS 1.6*  EOSABS 0.0  BASOSABS 0.1   ------------------------------------------------------------------------------------------------------------------  Results for orders placed or performed during the hospital encounter of 09/19/19 (from the past 48 hour(s))  CBC with Differential     Status: Abnormal   Collection Time: 09/19/19  5:43 PM  Result Value Ref Range   WBC 19.2 (H) 4.0 - 10.5 K/uL   RBC 4.53 3.87 - 5.11 MIL/uL   Hemoglobin 13.5 12.0 - 15.0 g/dL   HCT 41.2 36.0 - 46.0 %   MCV 90.9 80.0 - 100.0 fL   MCH 29.8 26.0 - 34.0 pg   MCHC 32.8 30.0 - 36.0 g/dL   RDW 13.2 11.5 - 15.5 %   Platelets 201 150 - 400 K/uL   nRBC 0.0 0.0 - 0.2 %   Neutrophils Relative % 78 %   Neutro Abs 14.7 (H) 1.7 - 7.7 K/uL   Lymphocytes Relative 14 %   Lymphs Abs 2.8 0.7 - 4.0 K/uL   Monocytes Relative 8 %   Monocytes Absolute 1.6 (H) 0.1 - 1.0 K/uL   Eosinophils Relative 0 %   Eosinophils Absolute 0.0 0.0 - 0.5 K/uL   Basophils Relative 0 %   Basophils Absolute 0.1 0.0 - 0.1 K/uL   Immature Granulocytes 0 %   Abs Immature Granulocytes 0.08 (H) 0.00 - 0.07 K/uL    Comment: Performed at [facility], [address] [place], [place] [address]  Comprehensive metabolic panel     Status: Abnormal   Collection Time: 09/19/19  5:43 PM  Result Value Ref Range   Sodium 133 (L) 135 - 145 mmol/L   Potassium 3.9 3.5 - 5.1 mmol/L   Chloride 101 98 - 111 mmol/L   CO2 23 22 - 32 mmol/L   Glucose, Bld 134 (H) 70 - 99 mg/dL    Comment: Glucose reference range applies only to samples taken after fasting for at least 8 hours.   BUN 11 6 - 20 mg/dL   Creatinine, Ser 0.93 0.44 - 1.00 mg/dL   Calcium 8.7 (L)  8.9 -  10.3 mg/dL   Total Protein 6.7 6.5 - 8.1 g/dL   Albumin 3.0 (L) 3.5 - 5.0 g/dL   AST 11 (L) 15 - 41 U/L   ALT 14 0 - 44 U/L   Alkaline Phosphatase 78 38 - 126 U/L   Total Bilirubin 1.1 0.3 - 1.2 mg/dL   GFR calc non Af Amer >60 >60 mL/min   GFR calc Af Amer >60 >60 mL/min   Anion gap 9 5 - 15    Comment: Performed at [facility], [address] [place], [place] [address]  SARS CORONAVIRUS 2 (TAT 6-24 HRS) Nasopharyngeal Nasopharyngeal Swab     Status: None   Collection Time: 09/19/19  5:43 PM   Specimen: Nasopharyngeal Swab  Result Value Ref Range   SARS Coronavirus 2 NEGATIVE NEGATIVE    Comment: (NOTE) SARS-CoV-2 target nucleic acids are NOT DETECTED. The SARS-CoV-2 RNA is generally detectable in upper and lower respiratory specimens during the acute phase of infection. Negative results do not preclude SARS-CoV-2 infection, do not rule out co-infections with other pathogens, and should not be used as the sole basis for treatment or other patient management decisions. Negative results must be combined with clinical observations, patient history, and epidemiological information. The expected result is Negative. Fact Sheet for Patients: HairSlick.nohttps://www.fda.gov/media/138098/download Fact Sheet for Healthcare Providers: quierodirigir.comhttps://www.fda.gov/media/138095/download This test is not yet approved or cleared by the [place] FDA and  has been authorized for detection and/or diagnosis of SARS-CoV-2 by FDA under an Emergency Use Authorization (EUA). This EUA will remain  in effect (meaning this test can be used) for the duration of the COVID-19 declaration under Section 56 4(b)(1) of the Act, 21 U.S.C. section 360bbb-3(b)(1), unless the authorization is terminated or revoked sooner. Performed at [facility], [address] [place], [place] [address]   Magnesium     Status: None   Collection Time: 09/19/19  5:43 PM  Result Value Ref Range   Magnesium 2.1 1.7 - 2.4  mg/dL    Comment: Performed at [facility], [address] [place], [place] [address]  Urinalysis, Routine w reflex microscopic     Status: Abnormal   Collection Time: 09/19/19  7:57 PM  Result Value Ref Range   Color, Urine YELLOW YELLOW   APPearance CLEAR CLEAR   Specific Gravity, Urine 1.012 1.005 - 1.030   pH 5.0 5.0 - 8.0   Glucose, UA NEGATIVE NEGATIVE mg/dL   Hgb urine dipstick NEGATIVE NEGATIVE   Bilirubin Urine NEGATIVE NEGATIVE   Ketones, ur 5 (A) NEGATIVE mg/dL   Protein, ur NEGATIVE NEGATIVE mg/dL   Nitrite NEGATIVE NEGATIVE   Leukocytes,Ua NEGATIVE NEGATIVE    Comment: Performed at [facility], [address] [place], [place] [address]  Urine rapid drug screen (hosp performed)     Status: None   Collection Time: 09/19/19  7:57 PM  Result Value Ref Range   Opiates NONE DETECTED NONE DETECTED   Cocaine NONE DETECTED NONE DETECTED   Benzodiazepines NONE DETECTED NONE DETECTED   Amphetamines NONE DETECTED NONE DETECTED   Tetrahydrocannabinol NONE DETECTED NONE DETECTED   Barbiturates NONE DETECTED NONE DETECTED    Comment: (NOTE) DRUG SCREEN FOR MEDICAL PURPOSES ONLY.  IF CONFIRMATION IS NEEDED FOR ANY PURPOSE, NOTIFY LAB WITHIN 5 DAYS. LOWEST DETECTABLE LIMITS FOR URINE DRUG SCREEN Drug Class                     Cutoff (ng/mL) Amphetamine  and metabolites    1000 Barbiturate and metabolites    200 Benzodiazepine                 200 Tricyclics and metabolites     300 Opiates and metabolites        300 Cocaine and metabolites        300 THC                            50 Performed at [facility], [address], [place], [place] [address]     Chemistries  Recent Labs  Lab 09/19/19 1743  NA 133*  K 3.9  CL 101  CO2 23  GLUCOSE 134*  BUN 11  CREATININE 0.93  CALCIUM 8.7*  MG 2.1  AST 11*  ALT 14  ALKPHOS 78  BILITOT 1.1    ------------------------------------------------------------------------------------------------------------------  ------------------------------------------------------------------------------------------------------------------ GFR: CrCl cannot be calculated (Unknown ideal weight.). Liver Function Tests: Recent Labs  Lab 09/19/19 1743  AST 11*  ALT 14  ALKPHOS 78  BILITOT 1.1  PROT 6.7  ALBUMIN 3.0*    --------------------------------------------------------------------------------------------------------------- Urine analysis:    Component Value Date/Time   COLORURINE YELLOW 09/19/2019 1957   APPEARANCEUR CLEAR 09/19/2019 1957   LABSPEC 1.012 09/19/2019 1957   PHURINE 5.0 09/19/2019 1957   GLUCOSEU NEGATIVE 09/19/2019 1957   HGBUR NEGATIVE 09/19/2019 1957   BILIRUBINUR NEGATIVE 09/19/2019 1957   KETONESUR 5 (A) 09/19/2019 1957   PROTEINUR NEGATIVE 09/19/2019 1957   NITRITE NEGATIVE 09/19/2019 1957   LEUKOCYTESUR NEGATIVE 09/19/2019 1957      Imaging Results:    CT Head Wo Contrast  Result Date: 09/19/2019 CLINICAL DATA:  Altered mental status; neuro deficit, subacute. Additional history provided: Altered mental status, decreased intake, increased fatigue. EXAM: CT HEAD WITHOUT CONTRAST TECHNIQUE: Contiguous axial images were obtained from the base of the skull through the vertex without intravenous contrast. COMPARISON:  Brain MRI 05/20/2019, head CT 05/18/2021 FINDINGS: Brain: There is no evidence of acute intracranial hemorrhage, intracranial mass, midline shift or extra-axial fluid collection.No demarcated cortical infarction. Cerebral volume is normal. Vascular: No hyperdense vessel. Atherosclerotic calcifications. Skull: Normal. Negative for fracture or focal lesion. Sinuses/Orbits: Visualized orbits demonstrate no acute abnormality. Mild ethmoid sinus mucosal thickening. No significant mastoid effusion. IMPRESSION: No evidence of acute intracranial abnormality. Mild  ethmoid sinus mucosal thickening. Electronically Signed   By: Jackey Loge DO   On: 09/19/2019 19:20   MR BRAIN WO CONTRAST  Result Date: 09/19/2019 CLINICAL DATA:  Encephalopathy EXAM: MRI HEAD WITHOUT CONTRAST TECHNIQUE: Multiplanar, multiecho pulse sequences of the brain and surrounding structures were obtained without intravenous contrast. COMPARISON:  Head CT 09/19/2019 and brain MRI 05/20/2019 FINDINGS: Brain: No acute infarct, acute hemorrhage or extra-axial collection. Multifocal white matter hyperintensity, most commonly due to chronic ischemic microangiopathy. Normal volume of CSF spaces. No chronic microhemorrhage. Normal midline structures. Vascular: Normal flow voids. Skull and upper cervical spine: Normal marrow signal. Sinuses/Orbits: Negative. Other: None. IMPRESSION: 1. No acute intracranial abnormality. 2. Multifocal white matter hyperintensity, most commonly due to chronic ischemic microangiopathy. Electronically Signed   By: Deatra Robinson M.D.   On: 09/19/2019 23:50   DG Chest Port 1 View  Result Date: 09/19/2019 CLINICAL DATA:  Altered mental status EXAM: PORTABLE CHEST 1 VIEW COMPARISON:  May 19, 2019 FINDINGS: The cardiac silhouette Mains enlarged. Again noted are aortic calcifications. There is vascular congestion without overt pulmonary edema. There is no pneumothorax. No large  pleural effusion. There is no acute osseous abnormality. IMPRESSION: Cardiomegaly and vascular congestion without overt pulmonary edema. Electronically Signed   By: Katherine Mantle M.D.   On: 09/19/2019 17:55    My personal review of EKG: Rhythm NSR, no ST/T changes   Assessment & Plan:    Active Problems:   Altered mental status   1. Altered mental status-unclear etiology, CT head unremarkable.  MRI brain also showed no acute abnormality.  Patient has normal UA.  Her home medication list includes Flexeril 10 mg 3 times daily as needed, Cymbalta 60 mg daily at bedtime, Lyrica 150 mg twice  daily, Vesicare.  Patient may have developed altered mental status due to polypharmacy.  Will hold these medications.  Patient started on gentle IV hydration with normal saline at 75 mill per hour.  I called and discussed with neurologist on-call Dr. Amada Jupiter, who recommends to observe patient overnight, holding these medications.  If no improvement by tomorrow consider neurology consultation in a.m. 2. Diabetes mellitus type 2-check CBG every 4 hours, sliding scale insulin with NovoLog. 3. Hyperlipidemia-hold Zocor at this time 4. ?  UTI-patient has been on Macrodantin 100 mg daily at home.  Patient's urine has been clear in the ED.  Will hold Macrodantin at this time.   DVT Prophylaxis-   Lovenox   AM Labs Ordered, also please review Full Orders  Family Communication: Admission, patients condition and plan of care including tests being ordered have been discussed with the patient and who indicate understanding and agree with the plan and Code Status.  Code Status: Full code  Admission status: Observation/Inpatient :The appropriate admission status for this patient is INPATIENT. Inpatient status is judged to be reasonable and necessary in order to provide the required intensity of service to ensure the patient's safety. The patient's presenting symptoms, physical exam findings, and initial radiographic and laboratory data in the context of their chronic comorbidities is felt to place them at high risk for further clinical deterioration. Furthermore, it is not anticipated that the patient will be medically stable for discharge from the hospital within 2 midnights of admission. The following factors support the admission status of inpatient.     The patient's presenting symptoms include altered mental status. The worrisome physical exam findings include altered mental status.      * I certify that at the point of admission it is my clinical judgment that the patient will require inpatient  hospital care spanning beyond 2 midnights from the point of admission due to high intensity of service, high risk for further deterioration and high frequency of surveillance required.*  Time spent in minutes : 60 minutes   Kelsei Defino S Lesta Limbert M.D

## 2019-09-21 DIAGNOSIS — Z20822 Contact with and (suspected) exposure to covid-19: Secondary | ICD-10-CM | POA: Diagnosis present

## 2019-09-21 DIAGNOSIS — E119 Type 2 diabetes mellitus without complications: Secondary | ICD-10-CM | POA: Diagnosis present

## 2019-09-21 DIAGNOSIS — K219 Gastro-esophageal reflux disease without esophagitis: Secondary | ICD-10-CM | POA: Diagnosis present

## 2019-09-21 DIAGNOSIS — G4733 Obstructive sleep apnea (adult) (pediatric): Secondary | ICD-10-CM | POA: Diagnosis present

## 2019-09-21 DIAGNOSIS — G9341 Metabolic encephalopathy: Secondary | ICD-10-CM | POA: Diagnosis present

## 2019-09-21 DIAGNOSIS — G92 Toxic encephalopathy: Secondary | ICD-10-CM | POA: Diagnosis present

## 2019-09-21 DIAGNOSIS — E785 Hyperlipidemia, unspecified: Secondary | ICD-10-CM | POA: Diagnosis present

## 2019-09-21 DIAGNOSIS — G8929 Other chronic pain: Secondary | ICD-10-CM | POA: Diagnosis present

## 2019-09-21 DIAGNOSIS — R410 Disorientation, unspecified: Secondary | ICD-10-CM

## 2019-09-21 DIAGNOSIS — N390 Urinary tract infection, site not specified: Secondary | ICD-10-CM | POA: Diagnosis present

## 2019-09-21 DIAGNOSIS — R4182 Altered mental status, unspecified: Secondary | ICD-10-CM | POA: Diagnosis not present

## 2019-09-21 DIAGNOSIS — I10 Essential (primary) hypertension: Secondary | ICD-10-CM | POA: Diagnosis present

## 2019-09-21 DIAGNOSIS — Z7984 Long term (current) use of oral hypoglycemic drugs: Secondary | ICD-10-CM | POA: Diagnosis not present

## 2019-09-21 DIAGNOSIS — T426X5A Adverse effect of other antiepileptic and sedative-hypnotic drugs, initial encounter: Secondary | ICD-10-CM | POA: Diagnosis present

## 2019-09-21 LAB — GLUCOSE, CAPILLARY
Glucose-Capillary: 139 mg/dL — ABNORMAL HIGH (ref 70–99)
Glucose-Capillary: 141 mg/dL — ABNORMAL HIGH (ref 70–99)
Glucose-Capillary: 70 mg/dL (ref 70–99)
Glucose-Capillary: 77 mg/dL (ref 70–99)
Glucose-Capillary: 78 mg/dL (ref 70–99)
Glucose-Capillary: 84 mg/dL (ref 70–99)
Glucose-Capillary: 85 mg/dL (ref 70–99)

## 2019-09-21 MED ORDER — DEXTROSE-NACL 5-0.9 % IV SOLN: INTRAVENOUS | Status: DC

## 2019-09-21 NOTE — Progress Notes (Addendum)
PROGRESS NOTE  Judy Downs UXN:[id number] DOB: August 02, 1960 DOA: 09/19/2019 PCP: Eather Colas, FNP   LOS: 0 days   Brief narrative: As per HPI,   Judy Downs  is a 59 y.o. female, with past medical history of diabetes mellitus type 2, hypertension, hyperlipidemia, sleep apnea, SVT was brought to hospital for altered mental status for 24 hours.  Patient had similar event in November 2020.  She has had UTI in the past with similar symptoms.  Patient was unable to provide any significant history on presentation. No previous history of stroke or seizures. In the ED CT of the head was unremarkable, MRI brain was unremarkable.  Neck was supple on examination, meningitis not suspected. UA was clear.  Chest x-ray unremarkable   Assessment/Plan:  Active Problems:   Altered mental status  Altered mental status likely metabolic encephalopathy from polypharmacy. Patient is still very confused and disoriented not at her baseline. CT head unremarkable.  MRI brain also showed no acute abnormality.   Urine analysis within normal limits. Patient is on multiple medications at home including Flexeril 10 mg 3 times daily as needed, Cymbalta 60 mg daily at bedtime, Lyrica 150 mg twice daily, Vesicare. Continue to hold these medications for now. Continue gentle IV fluid hydration for now. Patient's mentation has a slightly improved today and is more alert but still confused. Neurology was consulted yesterday and at this time will continue to monitor the patient. No focal deficits are noted.   Clears and advance as mentation improves.  Diabetes mellitus type 2-mild hypoglycemia this morning. Continue sliding scale insulin Accu-Cheks diabetic diet as tolerated. Change normal saline to D5 normal saline at this time.  Hyperlipidemia on Zocor at home. On hold.  UTI-patient was recently on Macrodantin at home. Urinalysis is negative. Will hold off with any antibiotics at this time.   VTE Prophylaxis:  Lovenox subcu  Code Status: Full code  Family Communication: None  Disposition Plan:  . Patient is from home . Likely disposition to home likely by tomorrow . Barriers to discharge: Persistent metabolic encephalopathy confusion disorientation.  Check for hypoglycemia.  Continue IV fluids.  Will change the patient's status to inpatient at this time.  Start clears and advance diet if mentation improves.   Consultants:  Neurology on the phone  Procedures:  MRI  Antibiotics:  . None  Anti-infectives (From admission, onward)   None     Subjective: Today, patient was seen and examined at bedside. Patient appears to be confused,disoriented. Denies any fever headache nausea vomiting  Objective: Vitals:   09/20/19 2001 09/21/19 0344  BP: 123/69 (!) 155/79  Pulse: 92 86  Resp: 18 18  Temp: 98.3 F (36.8 C) 98.5 F (36.9 C)  SpO2: 95% 97%    Intake/Output Summary (Last 24 hours) at 09/21/2019 1218 Last data filed at 09/21/2019 0900 Gross per 24 hour  Intake 1843 ml  Output --  Net 1843 ml   There were no vitals filed for this visit. There is no height or weight on file to calculate BMI.   Physical Exam: GENERAL: Patient is alert awake but confused and disoriented to time and place. Not in obvious distress. HENT: No scleral pallor or icterus. Pupils equally reactive to light. Oral mucosa is moist NECK: is supple, no gross swelling noted. CHEST: Clear to auscultation. No crackles or wheezes.  Diminished breath sounds bilaterally. CVS: S1 and S2 heard, no murmur. Regular rate and rhythm.  ABDOMEN: Soft, non-tender, bowel sounds are present. EXTREMITIES: No  edema. CNS: Cranial nerves are intact. No focal motor deficits. SKIN: warm and dry without rashes.  Data Review: I have personally reviewed the following laboratory data and studies,  CBC: Recent Labs  Lab 09/19/19 1743 09/20/19 0556  WBC 19.2* 13.1*  NEUTROABS 14.7*  --   HGB 13.5 12.5  HCT 41.2 38.4  MCV  90.9 90.6  PLT 201 190   Basic Metabolic Panel: Recent Labs  Lab 09/19/19 1743 09/20/19 0556  NA 133* 138  K 3.9 3.9  CL 101 104  CO2 23 23  GLUCOSE 134* 108*  BUN 11 9  CREATININE 0.93 0.70  CALCIUM 8.7* 8.7*  MG 2.1  --    Liver Function Tests: Recent Labs  Lab 09/19/19 1743 09/20/19 0556  AST 11* 13*  ALT 14 13  ALKPHOS 78 87  BILITOT 1.1 1.1  PROT 6.7 6.4*  ALBUMIN 3.0* 2.8*   No results for input(s): LIPASE, AMYLASE in the last 168 hours. No results for input(s): AMMONIA in the last 168 hours. Cardiac Enzymes: No results for input(s): CKTOTAL, CKMB, CKMBINDEX, TROPONINI in the last 168 hours. BNP (last 3 results) No results for input(s): BNP in the last 8760 hours.  ProBNP (last 3 results) No results for input(s): PROBNP in the last 8760 hours.  CBG: Recent Labs  Lab 09/20/19 1707 09/20/19 2003 09/20/19 2332 09/21/19 0345 09/21/19 0744  GLUCAP 73 73 79 78 77   Recent Results (from the past 240 hour(s))  SARS CORONAVIRUS 2 (TAT 6-24 HRS) Nasopharyngeal Nasopharyngeal Swab     Status: None   Collection Time: 09/19/19  5:43 PM   Specimen: Nasopharyngeal Swab  Result Value Ref Range Status   SARS Coronavirus 2 NEGATIVE NEGATIVE Final    Comment: (NOTE) SARS-CoV-2 target nucleic acids are NOT DETECTED. The SARS-CoV-2 RNA is generally detectable in upper and lower respiratory specimens during the acute phase of infection. Negative results do not preclude SARS-CoV-2 infection, do not rule out co-infections with other pathogens, and should not be used as the sole basis for treatment or other patient management decisions. Negative results must be combined with clinical observations, patient history, and epidemiological information. The expected result is Negative. Fact Sheet for Patients: HairSlick.no Fact Sheet for Healthcare Providers: quierodirigir.com This test is not yet approved or cleared by  the [place] FDA and  has been authorized for detection and/or diagnosis of SARS-CoV-2 by FDA under an Emergency Use Authorization (EUA). This EUA will remain  in effect (meaning this test can be used) for the duration of the COVID-19 declaration under Section 56 4(b)(1) of the Act, 21 U.S.C. section 360bbb-3(b)(1), unless the authorization is terminated or revoked sooner. Performed at [facility], [address] [place], [place] [address]      Studies: CT Head Wo Contrast  Result Date: 09/19/2019 CLINICAL DATA:  Altered mental status; neuro deficit, subacute. Additional history provided: Altered mental status, decreased intake, increased fatigue. EXAM: CT HEAD WITHOUT CONTRAST TECHNIQUE: Contiguous axial images were obtained from the base of the skull through the vertex without intravenous contrast. COMPARISON:  Brain MRI 05/20/2019, head CT 05/18/2021 FINDINGS: Brain: There is no evidence of acute intracranial hemorrhage, intracranial mass, midline shift or extra-axial fluid collection.No demarcated cortical infarction. Cerebral volume is normal. Vascular: No hyperdense vessel. Atherosclerotic calcifications. Skull: Normal. Negative for fracture or focal lesion. Sinuses/Orbits: Visualized orbits demonstrate no acute abnormality. Mild ethmoid sinus mucosal thickening. No significant mastoid effusion. IMPRESSION: No evidence of acute intracranial abnormality. Mild ethmoid sinus  mucosal thickening. Electronically Signed   By: Kellie Simmering DO   On: 09/19/2019 19:20   MR BRAIN WO CONTRAST  Result Date: 09/19/2019 CLINICAL DATA:  Encephalopathy EXAM: MRI HEAD WITHOUT CONTRAST TECHNIQUE: Multiplanar, multiecho pulse sequences of the brain and surrounding structures were obtained without intravenous contrast. COMPARISON:  Head CT 09/19/2019 and brain MRI 05/20/2019 FINDINGS: Brain: No acute infarct, acute hemorrhage or extra-axial collection. Multifocal white matter hyperintensity, most  commonly due to chronic ischemic microangiopathy. Normal volume of CSF spaces. No chronic microhemorrhage. Normal midline structures. Vascular: Normal flow voids. Skull and upper cervical spine: Normal marrow signal. Sinuses/Orbits: Negative. Other: None. IMPRESSION: 1. No acute intracranial abnormality. 2. Multifocal white matter hyperintensity, most commonly due to chronic ischemic microangiopathy. Electronically Signed   By: Ulyses Jarred M.D.   On: 09/19/2019 23:50   DG Chest Port 1 View  Result Date: 09/19/2019 CLINICAL DATA:  Altered mental status EXAM: PORTABLE CHEST 1 VIEW COMPARISON:  May 19, 2019 FINDINGS: The cardiac silhouette Mains enlarged. Again noted are aortic calcifications. There is vascular congestion without overt pulmonary edema. There is no pneumothorax. No large pleural effusion. There is no acute osseous abnormality. IMPRESSION: Cardiomegaly and vascular congestion without overt pulmonary edema. Electronically Signed   By: Constance Holster M.D.   On: 09/19/2019 17:55      Flora Lipps, MD  [facility] 09/21/2019

## 2019-09-22 DIAGNOSIS — N390 Urinary tract infection, site not specified: Secondary | ICD-10-CM

## 2019-09-22 DIAGNOSIS — G9341 Metabolic encephalopathy: Secondary | ICD-10-CM

## 2019-09-22 LAB — COMPREHENSIVE METABOLIC PANEL
ALT: 22 U/L (ref 0–44)
AST: 19 U/L (ref 15–41)
Albumin: 2.5 g/dL — ABNORMAL LOW (ref 3.5–5.0)
Alkaline Phosphatase: 75 U/L (ref 38–126)
Anion gap: 11 (ref 5–15)
BUN: 8 mg/dL (ref 6–20)
CO2: 25 mmol/L (ref 22–32)
Calcium: 8.7 mg/dL — ABNORMAL LOW (ref 8.9–10.3)
Chloride: 106 mmol/L (ref 98–111)
Creatinine, Ser: 0.79 mg/dL (ref 0.44–1.00)
GFR calc Af Amer: >60 mL/min (ref >60)
GFR calc non Af Amer: >60 mL/min (ref >60)
Glucose, Bld: 140 mg/dL — ABNORMAL HIGH (ref 70–99)
Potassium: 3.6 mmol/L (ref 3.5–5.1)
Sodium: 142 mmol/L (ref 135–145)
Total Bilirubin: 0.8 mg/dL (ref 0.3–1.2)
Total Protein: 6.1 g/dL — ABNORMAL LOW (ref 6.5–8.1)

## 2019-09-22 LAB — URINALYSIS, ROUTINE W REFLEX MICROSCOPIC
Bilirubin Urine: NEGATIVE
Glucose, UA: NEGATIVE mg/dL
Hgb urine dipstick: NEGATIVE
Ketones, ur: NEGATIVE mg/dL
Nitrite: NEGATIVE
Protein, ur: NEGATIVE mg/dL
Specific Gravity, Urine: 1.01 (ref 1.005–1.030)
pH: 5 (ref 5.0–8.0)

## 2019-09-22 LAB — CBC
HCT: 36.7 % (ref 36.0–46.0)
Hemoglobin: 12 g/dL (ref 12.0–15.0)
MCH: 29.3 pg (ref 26.0–34.0)
MCHC: 32.7 g/dL (ref 30.0–36.0)
MCV: 89.7 fL (ref 80.0–100.0)
Platelets: 217 10*3/uL (ref 150–400)
RBC: 4.09 MIL/uL (ref 3.87–5.11)
RDW: 13.1 % (ref 11.5–15.5)
WBC: 7.4 10*3/uL (ref 4.0–10.5)
nRBC: 0 % (ref 0.0–0.2)

## 2019-09-22 LAB — GLUCOSE, CAPILLARY
Glucose-Capillary: 128 mg/dL — ABNORMAL HIGH (ref 70–99)
Glucose-Capillary: 129 mg/dL — ABNORMAL HIGH (ref 70–99)
Glucose-Capillary: 133 mg/dL — ABNORMAL HIGH (ref 70–99)
Glucose-Capillary: 82 mg/dL (ref 70–99)

## 2019-09-22 LAB — MAGNESIUM: Magnesium: 1.7 mg/dL (ref 1.7–2.4)

## 2019-09-22 MED ORDER — SULFAMETHOXAZOLE-TRIMETHOPRIM 800-160 MG PO TABS: 1.0000 | ORAL_TABLET | Freq: Two times a day (BID) | ORAL | 0 refills | Status: AC

## 2019-09-22 NOTE — Plan of Care (Signed)

## 2019-09-22 NOTE — Progress Notes (Signed)
Pt VS stable, A/O, AVS, education was given. All questions are answered. Belongings at the bedside. Pt waiting on a ride home.

## 2019-09-22 NOTE — Discharge Instructions (Signed)
Confusion °Confusion is the inability to think with the usual speed or clarity. People who are confused often describe their thinking as cloudy or unclear. Confusion can also include feeling disoriented. This means you are unaware of where you are or who you are. You may also not know the date or time. When confused, you may have difficulty remembering, paying attention, or making decisions. Some people also act aggressively when they are confused. °In some cases, confusion may come on quickly. In other cases, it may develop slowly over time. How quickly confusion comes on depends on the cause. °Confusion may be caused by: °· Head injury (concussion). °· Seizures. °· Stroke. °· Fever. °· Brain tumor. °· Decrease in brain function due to a vascular or neurologic condition (dementia). °· Emotions, like rage or terror. °· Inability to know what is real and what is not (hallucinations). °· Infections, such as a urinary tract infection (UTI). °· Using too much alcohol, drugs, or medicines. °· Loss of fluid (dehydration) or an imbalance of salts in the body (electrolytes). °· Lack of sleep. °· Low blood sugar (diabetes). °· Low levels of oxygen. This comes from conditions such as chronic lung disorders. °· Side effects of medicines, or taking medicines that affect other medicines (drug interactions). °· Lack of certain nutrients, especially niacin, thiamine, vitamin C, or vitamin B. °· Sudden drop in body temperature (hypothermia). °· Change in routine, such as traveling or being hospitalized. °Follow these instructions at home: °Pay attention to your symptoms. Tell your health care provider about any changes or if you develop new symptoms. Follow these instructions to control or treat symptoms. Ask a family member or friend for help if needed. °Medicines °· Take over-the-counter and prescription medicines only as told by your health care provider. °· Ask your health care provider about changing or stopping any medicines  that may be causing your confusion. °· Avoid pain medicines or sleep medicines until you have fully recovered. °· Use a pillbox or an alarm to help you take the right medicines at the right time. °Lifestyle ° °· Eat a balanced diet that includes fruits and vegetables. °· Get enough sleep. For most adults, this is 7-9 hours each night. °· Do not drink alcohol. °· Do not become isolated. Spend time with other people and make plans for your days. °· Do not drive until your health care provider says that it is safe to do so. °· Do not use any products that contain nicotine or tobacco, such as cigarettes and e-cigarettes. If you need help quitting, ask your health care provider. °· Stop other activities that may increase your chances of getting hurt. These may include some work duties, sports activities, swimming, or bike riding. Ask your health care provider what activities are safe for you. °What caregivers can do °· Find out if the person is confused. Ask the person to state his or her name, age, and the date. If the person is unsure or answers incorrectly, he or she may be confused. °· Always introduce yourself, no matter how well the person knows you. °· Remind the person of his or her location. Do this often. °· Place a calendar and clock near the person who is confused. °· Talk about current events and plans for the day. °· Keep the environment calm, quiet, and peaceful. °· Help the person do the things that he or she is unable to do. These include: °? Taking medicines. °? Keeping follow-up visits with his or her health care   provider. ? Helping with household duties, including meal preparation. ? Running errands.  Get help if you need it. There are several support groups for caregivers.  If the person you are helping needs more support, consider day care, extended care programs, or a skilled nursing facility. The person's health care provider may be able to help evaluate these options. General  instructions  Monitor yourself for any conditions you may have. These may include: ? Checking your blood glucose levels, if you have diabetes. ? Watching your weight, if you are overweight. ? Monitoring your blood pressure, if you have hypertension. ? Monitoring your body temperature, if you have a fever.  Keep all follow-up visits as told by your health care provider. This is important. Contact a health care provider if:  Your symptoms get worse. Get help right away if you:  Feel that you are not able to care for yourself.  Develop severe headaches, repeated vomiting, seizures, blackouts, or slurred speech.  Have increasing confusion, weakness, numbness, restlessness, or personality changes.  Develop a loss of balance, have marked dizziness, feel uncoordinated, or fall.  Develop severe anxiety, or you have delusions or hallucinations. These symptoms may represent a serious problem that is an emergency. Do not wait to see if the symptoms will go away. Get medical help right away. Call your local emergency services (911 in the [place] Do not drive yourself to the hospital. Summary  Confusion is the inability to think with the usual speed or clarity. People who are confused often describe their thinking as cloudy or unclear.  Confusion can also include having difficulty remembering, paying attention, or making decisions.  Confusion may come on quickly or develop slowly over time, depending on the cause. There are many different causes of confusion.  Ask for help from family members or friends if you are unable to take care of yourself. This information is not intended to replace advice given to you by your health care provider. Make sure you discuss any questions you have with your health care provider. Document Revised: 06/11/2017 Document Reviewed: 06/11/2017 Elsevier Patient Education  [place].   Delirium Delirium is a state of mental confusion. It comes on quickly  and causes significant changes in a person's thinking and behavior. People with delirium usually have trouble paying attention to what is going on or knowing where they are. They may become very withdrawn or very emotional and unable to sit still. They may even see or feel things that are not there (hallucinations). Delirium is a sign of a serious underlying medical condition. What are the causes? Delirium occurs when something suddenly affects the signals that the brain sends out. Brain signals can be affected by anything that puts severe stress on the body and brain and causes brain chemicals to be out of balance. The most common causes of delirium include:  Infections. These may be bacterial, viral, fungal, or protozoal.  Medicines. These include many over-the-counter and prescription medicines.  Recreational drugs.  Substance withdrawal. This occurs with sudden discontinuation of alcohol, certain medicines, or recreational drugs.  Surgery and anesthesia.  Sudden vascular events, such as stroke and brain hemorrhage.  Other brain disorders, such as migraines, tumors, seizures, and physical head trauma.  Metabolic disorders, such as kidney or liver failure.  Low blood oxygen (anoxia). This may occur with lung disease, cardiac arrest, or carbon monoxide poisoning.  Hormone imbalances (endocrinopathies), such as an overactive thyroid (hyperthyroidism) or underactive thyroid (hypothyroidism).  Vitamin deficiencies. What increases the  risk? The following factors may make someone more likely to develop this condition.  Being a child.  Being an older person.  Living alone.  Having vision loss or hearing loss.  Having an existing brain disease, such as dementia.  Having long-lasting (chronic) medical conditions, such as heart disease.  Being hospitalized for long periods of time. What are the signs or symptoms? Delirium starts with a sudden change in a person's thinking or  behavior. Symptoms include:  Not being able to stay awake (drowsiness) or pay attention.  Being confused about places, time, and people.  Forgetfulness.  Having extreme energy levels. These may be low or high.  Changes in sleep patterns.  Extreme mood swings, such as sudden anger or anxiety.  Focusing on things or ideas that are not important.  Rambling and senseless talking.  Difficulty speaking, understanding speech, or both.  Hallucinations.  Tremor or unsteady gait. Symptoms come and go (fluctuate) over time, and they are often worse at the end of the day. How is this diagnosed? People with delirium may not realize that they have the condition. Often, a family member or health care provider is the first person to notice the changes. This condition may be diagnosed based on a physical exam, health history, and tests.  The health care provider will obtain a detailed history. This may include questions about: ? Current symptoms. ? Medical issues. ? Medicines. ? Recreational drug use.  The health care provider will perform a mental status examination by: ? Asking questions to check for confusion. ? Watching for abnormal behavior.  The health care provider may also order lab tests or additional studies to determine the cause of the delirium. How is this treated? Treatment of delirium depends on the cause and severity. Delirium usually goes away within days or weeks of treating the underlying cause. In the meantime, do not leave the person alone because he or she may accidentally cause self-harm. This condition may be treated with supportive care, such as:  Increased light during the day and decreased light at night.  Low noise level.  Uninterrupted sleep.  A regular daily schedule.  Clocks and calendars to help with orientation.  Familiar objects, including the person's pictures and clothing.  Frequent visits from familiar family and friends.  A healthy  diet.  Gentle exercise. In more severe cases of delirium, medicine may be prescribed to help the person keep calm and think more clearly. Follow these instructions at home:  Continue supportive care as told by a health care provider.  Over-the-counter and prescription medicines should be taken only as told by a health care provider.  Ask a health care provider before using herbs or supplements.  Do not use alcohol or recreational drugs.  Keep all follow-up visits as told by a health care provider. This is important. Contact a health care provider if:  Symptoms do not get better or they become worse.  New symptoms of delirium develop.  Caring for the person at home does not seem safe.  Eating, drinking, or communicating stops.  There are side effects of medicines, such as changes in sleep patterns, dizziness, weight gain, restlessness, movement changes, or tremors. Get help right away if:  Serious thoughts occur about self-harm or about hurting others.  There are serious side effects of medicine, such as: ? Swelling of the face, lips, tongue, or throat. ? Fever, confusion, muscle spasms, or seizures. Summary  Delirium is a state of mental confusion. It comes on quickly and  causes significant changes in a person's thinking and behavior.  Delirium is a sign of a serious underlying medical condition.  Certain medical conditions or a long hospital stay may increase the risk of developing delirium.  Treatment of delirium involves treating the underlying cause and providing supportive treatments, such as a calm and familiar environment. This information is not intended to replace advice given to you by your health care provider. Make sure you discuss any questions you have with your health care provider. Document Revised: 01/28/2018 Document Reviewed: 01/28/2018 Elsevier Patient Education  [place].  Urinary Tract Infection, Adult A urinary tract infection (UTI) is  an infection of any part of the urinary tract. The urinary tract includes: The kidneys. The ureters. The bladder. The urethra. These organs make, store, and get rid of pee (urine) in the body. What are the causes? This is caused by germs (bacteria) in your genital area. These germs grow and cause swelling (inflammation) of your urinary tract. What increases the risk? You are more likely to develop this condition if: You have a small, thin tube (catheter) to drain pee. You cannot control when you pee or poop (incontinence). You are female, and: You use these methods to prevent pregnancy: A medicine that kills sperm (spermicide). A device that blocks sperm (diaphragm). You have low levels of a female hormone (estrogen). You are pregnant. You have genes that add to your risk. You are sexually active. You take antibiotic medicines. You have trouble peeing because of: A prostate that is bigger than normal, if you are female. A blockage in the part of your body that drains pee from the bladder (urethra). A kidney stone. A nerve condition that affects your bladder (neurogenic bladder). Not getting enough to drink. Not peeing often enough. You have other conditions, such as: Diabetes. A weak disease-fighting system (immune system). Sickle cell disease. Gout. Injury of the spine. What are the signs or symptoms? Symptoms of this condition include: Needing to pee right away (urgently). Peeing often. Peeing small amounts often. Pain or burning when peeing. Blood in the pee. Pee that smells bad or not like normal. Trouble peeing. Pee that is cloudy. Fluid coming from the vagina, if you are female. Pain in the belly or lower back. Other symptoms include: Throwing up (vomiting). No urge to eat. Feeling mixed up (confused). Being tired and grouchy (irritable). A fever. Watery poop (diarrhea). How is this treated? This condition may be treated with: Antibiotic medicine. Other  medicines. Drinking enough water. Follow these instructions at home:  Medicines Take over-the-counter and prescription medicines only as told by your doctor. If you were prescribed an antibiotic medicine, take it as told by your doctor. Do not stop taking it even if you start to feel better. General instructions Make sure you: Pee until your bladder is empty. Do not hold pee for a long time. Empty your bladder after sex. Wipe from front to back after pooping if you are a female. Use each tissue one time when you wipe. Drink enough fluid to keep your pee pale yellow. Keep all follow-up visits as told by your doctor. This is important. Contact a doctor if: You do not get better after 1-2 days. Your symptoms go away and then come back. Get help right away if: You have very bad back pain. You have very bad pain in your lower belly. You have a fever. You are sick to your stomach (nauseous). You are throwing up. Summary A urinary tract infection (UTI)  is an infection of any part of the urinary tract. This condition is caused by germs in your genital area. There are many risk factors for a UTI. These include having a small, thin tube to drain pee and not being able to control when you pee or poop. Treatment includes antibiotic medicines for germs. Drink enough fluid to keep your pee pale yellow. This information is not intended to replace advice given to you by your health care provider. Make sure you discuss any questions you have with your health care provider. Document Revised: 05/27/2018 Document Reviewed: 12/17/2017 Elsevier Patient Education  2020 [employer].

## 2019-09-22 NOTE — Discharge Summary (Addendum)
Physician Discharge Summary  Ashana Tullo AYT:[id number] DOB: 11/19/1960 DOA: 09/19/2019  PCP: Eather Colas, FNP  Admit date: 09/19/2019 Discharge date: 09/22/2019  Admitted From: Home Disposition: Home  Recommendations for Outpatient Follow-up:  1. Follow up with PCP in 1-2 weeks 2. Please obtain BMP/CBC in one week 3. Please follow up on the following pending results:  Home Health: None Equipment/Devices: None  Discharge Condition: Stable CODE STATUS: Full code Diet recommendation: Cardiac  Subjective: Seen and examined.  She feels much better.  She is alert and oriented.  Her only complaint she has a suprapubic pain.  She had fever last night.  She wants to go home.  Brief/Interim Summary: JeanineKingsleyis a58 y.o.female,with past medical history of diabetes mellitus type 2, hypertension, hyperlipidemia, sleep apnea, SVT was brought to hospital for altered mental status for 24 hours. Patient had similar event in November 2020. She has had UTI in the past with similar symptoms. Patient was unable to provide any significant history on presentation. No previous history of stroke or seizures. In the ED CT of the head was unremarkable, MRI brain was unremarkable.  Neck was supple on examination, meningitis not suspected. UA was clear. Chest x-ray unremarkable.  Patient was admitted to hospital service with acute metabolic encephalopathy.  This was presumed to be due to polypharmacy as she was on Flexeril, Cymbalta and Lyrica at home.  All the offending medications were held.  Over the course of last 2 to 3 days, patient has improved significantly.  Currently she is alert and oriented.  She has no complaints however she had fever of 101.2 last night.  Upon further questioning she endorsed having some suprapubic pain.  On examination she had very minimal suprapubic tenderness but no CVA tenderness.  I repeated her urine analysis which showed bacteria and WBC.  Based on this, she might  have UTI.  I offered patient to stay overnight so we can observe for fever however she wanted to go home and she felt comfortable going home.  For presumed UTI, I have elected to discharge her on 5 days of oral Bactrim DS.  She understands that she needs to call her PCP if she develops fever or has any other symptom.  Discharge Diagnoses:  Active Problems:   Altered mental status   Metabolic encephalopathy   Acute lower UTI    Discharge Instructions   Allergies as of 09/22/2019   No Known Allergies     Medication List    STOP taking these medications   prochlorperazine 10 MG tablet Commonly known as: COMPAZINE     TAKE these medications   cyclobenzaprine 10 MG tablet Commonly known as: FLEXERIL Take 10 mg by mouth 3 (three) times daily as needed for muscle spasms.   DULoxetine 60 MG capsule Commonly known as: CYMBALTA Take 60 mg by mouth at bedtime.   Magnesium 500 MG Tabs Take 1 tablet by mouth every evening.   metFORMIN 500 MG tablet Commonly known as: GLUCOPHAGE Take 500 mg by mouth 2 (two) times daily.   nitrofurantoin 100 MG capsule Commonly known as: MACRODANTIN Take 100 mg by mouth daily.   pregabalin 150 MG capsule Commonly known as: LYRICA Take 150 mg by mouth 2 (two) times daily.   simvastatin 20 MG tablet Commonly known as: ZOCOR Take 20 mg by mouth daily.   solifenacin 10 MG tablet Commonly known as: VESICARE Take 10 mg by mouth at bedtime.   sulfamethoxazole-trimethoprim 800-160 MG tablet Commonly known as: BACTRIM DS Take  1 tablet by mouth 2 (two) times daily for 5 days.      Follow-up Information    Antonieta Pert A, FNP Follow up in 1 week(s).   Specialty: Nurse Practitioner Contact information: [address] [place] [address] [phone]          No Known Allergies  Consultations: None   Procedures/Studies: CT Head Wo Contrast  Result Date: 09/19/2019 CLINICAL DATA:  Altered mental status; neuro  deficit, subacute. Additional history provided: Altered mental status, decreased intake, increased fatigue. EXAM: CT HEAD WITHOUT CONTRAST TECHNIQUE: Contiguous axial images were obtained from the base of the skull through the vertex without intravenous contrast. COMPARISON:  Brain MRI 05/20/2019, head CT 05/18/2021 FINDINGS: Brain: There is no evidence of acute intracranial hemorrhage, intracranial mass, midline shift or extra-axial fluid collection.No demarcated cortical infarction. Cerebral volume is normal. Vascular: No hyperdense vessel. Atherosclerotic calcifications. Skull: Normal. Negative for fracture or focal lesion. Sinuses/Orbits: Visualized orbits demonstrate no acute abnormality. Mild ethmoid sinus mucosal thickening. No significant mastoid effusion. IMPRESSION: No evidence of acute intracranial abnormality. Mild ethmoid sinus mucosal thickening. Electronically Signed   By: Kellie Simmering DO   On: 09/19/2019 19:20   MR BRAIN WO CONTRAST  Result Date: 09/19/2019 CLINICAL DATA:  Encephalopathy EXAM: MRI HEAD WITHOUT CONTRAST TECHNIQUE: Multiplanar, multiecho pulse sequences of the brain and surrounding structures were obtained without intravenous contrast. COMPARISON:  Head CT 09/19/2019 and brain MRI 05/20/2019 FINDINGS: Brain: No acute infarct, acute hemorrhage or extra-axial collection. Multifocal white matter hyperintensity, most commonly due to chronic ischemic microangiopathy. Normal volume of CSF spaces. No chronic microhemorrhage. Normal midline structures. Vascular: Normal flow voids. Skull and upper cervical spine: Normal marrow signal. Sinuses/Orbits: Negative. Other: None. IMPRESSION: 1. No acute intracranial abnormality. 2. Multifocal white matter hyperintensity, most commonly due to chronic ischemic microangiopathy. Electronically Signed   By: Ulyses Jarred M.D.   On: 09/19/2019 23:50   DG Chest Port 1 View  Result Date: 09/19/2019 CLINICAL DATA:  Altered mental status EXAM: PORTABLE  CHEST 1 VIEW COMPARISON:  May 19, 2019 FINDINGS: The cardiac silhouette Mains enlarged. Again noted are aortic calcifications. There is vascular congestion without overt pulmonary edema. There is no pneumothorax. No large pleural effusion. There is no acute osseous abnormality. IMPRESSION: Cardiomegaly and vascular congestion without overt pulmonary edema. Electronically Signed   By: Constance Holster M.D.   On: 09/19/2019 17:55     Discharge Exam: Vitals:   09/21/19 2215 09/22/19 0508  BP:  128/76  Pulse:  75  Resp:  16  Temp: 98.3 F (36.8 C) 98.4 F (36.9 C)  SpO2:  96%   Vitals:   09/21/19 1312 09/21/19 2018 09/21/19 2215 09/22/19 0508  BP: (!) 142/75 140/73  128/76  Pulse: 79 84  75  Resp: 15 16  16   Temp: 98.1 F (36.7 C) (!) 101.2 F (38.4 C) 98.3 F (36.8 C) 98.4 F (36.9 C)  TempSrc: Oral Oral Oral Oral  SpO2: 96% 94%  96%    General: Pt is alert, awake, not in acute distress Cardiovascular: RRR, S1/S2 +, no rubs, no gallops Respiratory: CTA bilaterally, no wheezing, no rhonchi Abdominal: Soft, mild suprapubic tenderness, ND, bowel sounds + Extremities: no edema, no cyanosis    The results of significant diagnostics from this hospitalization (including imaging, microbiology, ancillary and laboratory) are listed below for reference.     Microbiology: Recent Results (from the past 240 hour(s))  SARS CORONAVIRUS 2 (TAT 6-24 HRS) Nasopharyngeal Nasopharyngeal Swab  Status: None   Collection Time: 09/19/19  5:43 PM   Specimen: Nasopharyngeal Swab  Result Value Ref Range Status   SARS Coronavirus 2 NEGATIVE NEGATIVE Final    Comment: (NOTE) SARS-CoV-2 target nucleic acids are NOT DETECTED. The SARS-CoV-2 RNA is generally detectable in upper and lower respiratory specimens during the acute phase of infection. Negative results do not preclude SARS-CoV-2 infection, do not rule out co-infections with other pathogens, and should not be used as the sole  basis for treatment or other patient management decisions. Negative results must be combined with clinical observations, patient history, and epidemiological information. The expected result is Negative. Fact Sheet for Patients: HairSlick.no Fact Sheet for Healthcare Providers: quierodirigir.com This test is not yet approved or cleared by the [place] FDA and  has been authorized for detection and/or diagnosis of SARS-CoV-2 by FDA under an Emergency Use Authorization (EUA). This EUA will remain  in effect (meaning this test can be used) for the duration of the COVID-19 declaration under Section 56 4(b)(1) of the Act, 21 U.S.C. section 360bbb-3(b)(1), unless the authorization is terminated or revoked sooner. Performed at [facility], [address] [place], [place] [address]      Labs: BNP (last 3 results) No results for input(s): BNP in the last 8760 hours. Basic Metabolic Panel: Recent Labs  Lab 09/19/19 1743 09/20/19 0556 09/22/19 0309  NA 133* 138 142  K 3.9 3.9 3.6  CL 101 104 106  CO2 23 23 25   GLUCOSE 134* 108* 140*  BUN 11 9 8   CREATININE 0.93 0.70 0.79  CALCIUM 8.7* 8.7* 8.7*  MG 2.1  --  1.7   Liver Function Tests: Recent Labs  Lab 09/19/19 1743 09/20/19 0556 09/22/19 0309  AST 11* 13* 19  ALT 14 13 22   ALKPHOS 78 87 75  BILITOT 1.1 1.1 0.8  PROT 6.7 6.4* 6.1*  ALBUMIN 3.0* 2.8* 2.5*   No results for input(s): LIPASE, AMYLASE in the last 168 hours. No results for input(s): AMMONIA in the last 168 hours. CBC: Recent Labs  Lab 09/19/19 1743 09/20/19 0556 09/22/19 0309  WBC 19.2* 13.1* 7.4  NEUTROABS 14.7*  --   --   HGB 13.5 12.5 12.0  HCT 41.2 38.4 36.7  MCV 90.9 90.6 89.7  PLT 201 190 217   Cardiac Enzymes: No results for input(s): CKTOTAL, CKMB, CKMBINDEX, TROPONINI in the last 168 hours. BNP: Invalid input(s): POCBNP CBG: Recent Labs  Lab 09/21/19 2213 09/21/19 2343  09/22/19 0504 09/22/19 0803 09/22/19 1322  GLUCAP 141* 139* 128* 129* 133*   D-Dimer No results for input(s): DDIMER in the last 72 hours. Hgb A1c Recent Labs    09/20/19 0556  HGBA1C 6.5*   Lipid Profile No results for input(s): CHOL, HDL, LDLCALC, TRIG, CHOLHDL, LDLDIRECT in the last 72 hours. Thyroid function studies No results for input(s): TSH, T4TOTAL, T3FREE, THYROIDAB in the last 72 hours.  Invalid input(s): FREET3 Anemia work up No results for input(s): VITAMINB12, FOLATE, FERRITIN, TIBC, IRON, RETICCTPCT in the last 72 hours. Urinalysis    Component Value Date/Time   COLORURINE YELLOW 09/22/2019 1217   APPEARANCEUR HAZY (A) 09/22/2019 1217   LABSPEC 1.010 09/22/2019 1217   PHURINE 5.0 09/22/2019 1217   GLUCOSEU NEGATIVE 09/22/2019 1217   HGBUR NEGATIVE 09/22/2019 1217   BILIRUBINUR NEGATIVE 09/22/2019 1217   KETONESUR NEGATIVE 09/22/2019 1217   PROTEINUR NEGATIVE 09/22/2019 1217   NITRITE NEGATIVE 09/22/2019 1217   LEUKOCYTESUR SMALL (A) 09/22/2019 1217  Sepsis Labs Invalid input(s): PROCALCITONIN,  WBC,  LACTICIDVEN Microbiology Recent Results (from the past 240 hour(s))  SARS CORONAVIRUS 2 (TAT 6-24 HRS) Nasopharyngeal Nasopharyngeal Swab     Status: None   Collection Time: 09/19/19  5:43 PM   Specimen: Nasopharyngeal Swab  Result Value Ref Range Status   SARS Coronavirus 2 NEGATIVE NEGATIVE Final    Comment: (NOTE) SARS-CoV-2 target nucleic acids are NOT DETECTED. The SARS-CoV-2 RNA is generally detectable in upper and lower respiratory specimens during the acute phase of infection. Negative results do not preclude SARS-CoV-2 infection, do not rule out co-infections with other pathogens, and should not be used as the sole basis for treatment or other patient management decisions. Negative results must be combined with clinical observations, patient history, and epidemiological information. The expected result is Negative. Fact Sheet for  Patients: HairSlick.no Fact Sheet for Healthcare Providers: quierodirigir.com This test is not yet approved or cleared by the [place] FDA and  has been authorized for detection and/or diagnosis of SARS-CoV-2 by FDA under an Emergency Use Authorization (EUA). This EUA will remain  in effect (meaning this test can be used) for the duration of the COVID-19 declaration under Section 56 4(b)(1) of the Act, 21 U.S.C. section 360bbb-3(b)(1), unless the authorization is terminated or revoked sooner. Performed at [facility], [address], [place], [place] [address]      Time coordinating discharge: Over 30 minutes  SIGNED:   Hughie Closs, MD  [facility] 09/22/2019, 1:46 PM  If 7PM-7AM, please contact night-coverage [email or web address]

## 2021-04-16 ENCOUNTER — Ambulatory Visit (INDEPENDENT_AMBULATORY_CARE_PROVIDER_SITE_OTHER): Payer: 59 | Admitting: Orthopaedic Surgery

## 2021-04-16 ENCOUNTER — Ambulatory Visit (INDEPENDENT_AMBULATORY_CARE_PROVIDER_SITE_OTHER): Payer: 59

## 2021-04-16 ENCOUNTER — Other Ambulatory Visit: Payer: Self-pay

## 2021-04-16 DIAGNOSIS — M25511 Pain in right shoulder: Secondary | ICD-10-CM

## 2021-04-16 DIAGNOSIS — G5601 Carpal tunnel syndrome, right upper limb: Secondary | ICD-10-CM | POA: Diagnosis not present

## 2021-04-16 MED ORDER — TRAMADOL HCL 50 MG PO TABS: 50.0000 mg | ORAL_TABLET | Freq: Two times a day (BID) | ORAL | 2 refills | Status: DC | PRN

## 2021-04-16 MED ORDER — DIAZEPAM 2 MG PO TABS: ORAL_TABLET | ORAL | 0 refills | Status: DC

## 2021-04-16 NOTE — Progress Notes (Signed)
Office Visit Note   Patient: Judy Downs           Date of Birth: 1961/02/01           MRN: [id number] Visit Date: 04/16/2021              Requested by: Eather Colas, FNP [address] [place],  [place] [address] PCP: Eather Colas, FNP   Assessment & Plan: Visit Diagnoses:  1. Right shoulder pain, unspecified chronicity   2. Right carpal tunnel syndrome     Plan: Impression is chronic right shoulder pain concerning for rotator cuff pathology and right hand carpal tunnel syndrome.  In regards to the right shoulder, she has tried physical therapy as well as subacromial cortisone injection without relief of symptoms.  I would like to order an MRI of the right shoulder to assess for rotator cuff pathology.  She will follow-up with [place] once that is been completed.  In regards to the right hand, we will refer her to Dr. Alvester Morin for nerve conduction study/EMG.  Follow-up with [place] once completed.  Call with concerns or questions.  Follow-Up Instructions: Return for after NCS/EMG and right shoulder MRI.   Orders:  Orders Placed This Encounter  Procedures   XR Shoulder Right   Meds ordered this encounter  Medications   diazepam (VALIUM) 2 MG tablet    Sig: Take one pill one hour prior to mri and then repeat just prior if needed    Dispense:  2 tablet    Refill:  0   traMADol (ULTRAM) 50 MG tablet    Sig: Take 1 tablet (50 mg total) by mouth every 12 (twelve) hours as needed.    Dispense:  30 tablet    Refill:  2       Procedures: No procedures performed   Clinical Data: No additional findings.   Subjective: Chief Complaint  Patient presents with   Right Shoulder - Pain    HPI patient is a 60 year old female who comes in today with right shoulder and right wrist pain.  In regards to the right shoulder, she has pain to the deltoid.  This is been ongoing for the past 5 years.  No known injury or change in activity.  She has associated weakness to  the right upper extremity.  Pain appears to be worse when she picks up anything heavy or playing on her phone.  She has tried Tylenol and Advil without significant relief.  She does note that she had an injection several years ago which minimally helped.  She also went to physical therapy without relief.  She had an MRI several years ago which she states showed a torn rotator cuff.  No surgical intervention performed.  In regards to the right wrist, she has had numbness and tingling to the volar forearm, thumb, ring and long fingers for several years.  She has tried night splinting without relief.  No previous carpal tunnel injection.  She thinks she had nerve conduction study several years ago but is unsure of the results.  Review of Systems as detailed in HPI.  All others reviewed and are negative.   Objective: Vital Signs: There were no vitals taken for this visit.  Physical Exam well-developed well-nourished female in no acute distress.  Alert and oriented x3.  Ortho Exam right shoulder exam shows forward flexion to 120 degrees.  Abduction to 90 degrees.  Positive empty can with 3 out of 5 strength.  She is neurovascular intact distally.  Right wrist exam she has a positive Phalen and positive Tinel.  No thenar atrophy.  She is neurovascular intact distally.  Specialty Comments:  No specialty comments available.  Imaging: XR Shoulder Right  Result Date: 04/16/2021 Moderate degenerative changes to the [facility] joint.  No superior migration of the humeral head.    PMFS History: Patient Active Problem List   Diagnosis Date Noted   Acute lower UTI 09/22/2019   Metabolic encephalopathy 09/21/2019   Altered mental status 09/20/2019   Past Medical History:  Diagnosis Date   Carpal tunnel syndrome    Chronic pain    Diabetes mellitus without complication (HCC)    GERD (gastroesophageal reflux disease)    Hyperlipidemia    Hypertension    OSA (obstructive sleep apnea)    Palpitation     SVT (supraventricular tachycardia) (HCC)     No family history on file.  Past Surgical History:  Procedure Laterality Date   KNEE ARTHROSCOPY     TONSILLECTOMY     TUBAL LIGATION     Social History   Occupational History   Not on file  Tobacco Use   Smoking status: Not on file   Smokeless tobacco: Not on file  Substance and Sexual Activity   Alcohol use: Not on file   Drug use: Not on file   Sexual activity: Not on file

## 2021-04-17 ENCOUNTER — Other Ambulatory Visit: Payer: Self-pay

## 2021-04-17 DIAGNOSIS — G5601 Carpal tunnel syndrome, right upper limb: Secondary | ICD-10-CM

## 2021-04-17 DIAGNOSIS — M25511 Pain in right shoulder: Secondary | ICD-10-CM

## 2021-04-22 ENCOUNTER — Telehealth: Payer: Self-pay | Admitting: Physical Medicine and Rehabilitation

## 2021-04-22 NOTE — Telephone Encounter (Signed)
Pt called requesting a call back about referral appt. Pt phone number is [phone]

## 2021-04-26 ENCOUNTER — Other Ambulatory Visit: Payer: Self-pay | Admitting: Physician Assistant

## 2021-04-30 ENCOUNTER — Encounter: Payer: Self-pay | Admitting: Physical Medicine and Rehabilitation

## 2021-04-30 ENCOUNTER — Other Ambulatory Visit: Payer: Self-pay

## 2021-04-30 ENCOUNTER — Ambulatory Visit (INDEPENDENT_AMBULATORY_CARE_PROVIDER_SITE_OTHER): Payer: 59 | Admitting: Physical Medicine and Rehabilitation

## 2021-04-30 DIAGNOSIS — R202 Paresthesia of skin: Secondary | ICD-10-CM | POA: Diagnosis not present

## 2021-04-30 NOTE — Progress Notes (Signed)
NCV done years ago. Right arm and hand pain. Right thumb pain. Right shoulder pain. Numbness in fingers. Burning sensation in arm. Right hand dominant No lotion per patient

## 2021-05-01 NOTE — Procedures (Signed)
EMG & NCV Findings: Evaluation of the right median motor nerve showed reduced amplitude (4.4 mV).  The right median (across palm) sensory nerve showed prolonged distal peak latency (Wrist, 3.9 ms).  All remaining nerves (as indicated in the following tables) were within normal limits.    All examined muscles (as indicated in the following table) showed no evidence of electrical instability.    Impression: The above electrodiagnostic study is ABNORMAL and reveals evidence of a mild to moderate right median nerve entrapment at the wrist (carpal tunnel syndrome) affecting sensory and motor components.   There is no significant electrodiagnostic evidence of any other focal nerve entrapment, brachial plexopathy or cervical radiculopathy in the right upper limb.   Recommendations: 1.  Follow-up with referring physician. 2.  Continue current management of symptoms. 3.  Continue use of resting splint at night-time and as needed during the day.  ___________________________ Elease Hashimoto Board Certified, American Board of Physical Medicine and Rehabilitation    Nerve Conduction Studies Anti Sensory Summary Table   Stim Site NR Peak (ms) Norm Peak (ms) P-T Amp (V) Norm P-T Amp Site1 Site2 Delta-P (ms) Dist (cm) Vel (m/s) Norm Vel (m/s)  Right Median Acr Palm Anti Sensory (2nd Digit)  31.3C  Wrist    *3.9 <3.6 29.6 >10 Wrist Palm 2.1 0.0    Palm    1.8 <2.0 41.9         Right Radial Anti Sensory (Base 1st Digit)  31.5C  Wrist    2.0 <3.1 30.1  Wrist Base 1st Digit 2.0 0.0    Right Ulnar Anti Sensory (5th Digit)  31.8C  Wrist    3.2 <3.7 32.5 >15.0 Wrist 5th Digit 3.2 14.0 44 >38   Motor Summary Table   Stim Site NR Onset (ms) Norm Onset (ms) O-P Amp (mV) Norm O-P Amp Site1 Site2 Delta-0 (ms) Dist (cm) Vel (m/s) Norm Vel (m/s)  Right Median Motor (Abd Poll Brev)  32C  Wrist    4.2 <4.2 *4.4 >5 Elbow Wrist 3.8 21.0 55 >50  Elbow    8.0  3.8         Right Ulnar Motor (Abd Dig Min)   32C  Wrist    3.0 <4.2 8.6 >3 B Elbow Wrist 3.3 18.0 55 >53  B Elbow    6.3  7.8  A Elbow B Elbow 0.9 10.0 111 >53  A Elbow    7.2  7.9          EMG   Side Muscle Nerve Root Ins Act Fibs Psw Amp Dur Poly Recrt Int Dennie Bible Comment  Right Abd Poll Brev Median C8-T1 Nml Nml Nml Nml Nml 0 Nml Nml   Right 1stDorInt Ulnar C8-T1 Nml Nml Nml Nml Nml 0 Nml Nml   Right PronatorTeres Median C6-7 Nml Nml Nml Nml Nml 0 Nml Nml   Right Biceps Musculocut C5-6 Nml Nml Nml Nml Nml 0 Nml Nml   Right Deltoid Axillary C5-6 Nml Nml Nml Nml Nml 0 Nml Nml     Nerve Conduction Studies Anti Sensory Left/Right Comparison   Stim Site L Lat (ms) R Lat (ms) L-R Lat (ms) L Amp (V) R Amp (V) L-R Amp (%) Site1 Site2 L Vel (m/s) R Vel (m/s) L-R Vel (m/s)  Median Acr Palm Anti Sensory (2nd Digit)  31.3C  Wrist  *3.9   29.6  Wrist Palm     Palm  1.8   41.9        Radial  Anti Sensory (Base 1st Digit)  31.5C  Wrist  2.0   30.1  Wrist Base 1st Digit     Ulnar Anti Sensory (5th Digit)  31.8C  Wrist  3.2   32.5  Wrist 5th Digit  44    Motor Left/Right Comparison   Stim Site L Lat (ms) R Lat (ms) L-R Lat (ms) L Amp (mV) R Amp (mV) L-R Amp (%) Site1 Site2 L Vel (m/s) R Vel (m/s) L-R Vel (m/s)  Median Motor (Abd Poll Brev)  32C  Wrist  4.2   *4.4  Elbow Wrist  55   Elbow  8.0   3.8        Ulnar Motor (Abd Dig Min)  32C  Wrist  3.0   8.6  B Elbow Wrist  55   B Elbow  6.3   7.8  A Elbow B Elbow  111   A Elbow  7.2   7.9           Waveforms:

## 2021-05-01 NOTE — Progress Notes (Signed)
Judy Downs - 60 y.o. female MRN [id number]  Date of birth: 1961-01-05  Office Visit Note: Visit Date: 04/30/2021 PCP: Eather Colas, FNP Referred by: Eather Colas, FNP  Subjective: Chief Complaint  Patient presents with   Right Arm - Pain   Right Hand - Pain, Numbness   HPI:  Judy Downs is a 60 y.o. female who comes in today at the request of Dr. Glee Arvin for electrodiagnostic study of the Right upper extremities.  Patient is Right hand dominant.  She reports significant right shoulder pain worse with movement but then the pain with referral pain into the right thumb and hand.  Pain is difficult with movement and trying to open objects and hold things.  She gets numbness in all of the fingers in a nondermatomal pattern on the right.  She also gets a burning sensation in the arm.  She is a type II diabetic with her last hemoglobin A1c of 8.2 just a few weeks ago.  She has had prior electrodiagnostic studies by her report but I do not have those to review.   ROS Otherwise per HPI.  Assessment & Plan: Visit Diagnoses:    ICD-10-CM   1. Paresthesia of skin  R20.2 NCV with EMG (electromyography)      Plan:Impression: The above electrodiagnostic study is ABNORMAL and reveals evidence of a mild to moderate right median nerve entrapment at the wrist (carpal tunnel syndrome) affecting sensory and motor components.   There is no significant electrodiagnostic evidence of any other focal nerve entrapment, brachial plexopathy or cervical radiculopathy in the right upper limb.   Recommendations: 1.  Follow-up with referring physician. 2.  Continue current management of symptoms. 3.  Continue use of resting splint at night-time and as needed during the day.  Meds & Orders: No orders of the defined types were placed in this encounter.   Orders Placed This Encounter  Procedures   NCV with EMG (electromyography)    Follow-up: Return in about 2 weeks (around  05/14/2021) for  Glee Arvin, MD.   Procedures: No procedures performed  EMG & NCV Findings: Evaluation of the right median motor nerve showed reduced amplitude (4.4 mV).  The right median (across palm) sensory nerve showed prolonged distal peak latency (Wrist, 3.9 ms).  All remaining nerves (as indicated in the following tables) were within normal limits.    All examined muscles (as indicated in the following table) showed no evidence of electrical instability.    Impression: The above electrodiagnostic study is ABNORMAL and reveals evidence of a mild to moderate right median nerve entrapment at the wrist (carpal tunnel syndrome) affecting sensory and motor components.   There is no significant electrodiagnostic evidence of any other focal nerve entrapment, brachial plexopathy or cervical radiculopathy in the right upper limb.   Recommendations: 1.  Follow-up with referring physician. 2.  Continue current management of symptoms. 3.  Continue use of resting splint at night-time and as needed during the day.  ___________________________ Elease Hashimoto Board Certified, American Board of Physical Medicine and Rehabilitation    Nerve Conduction Studies Anti Sensory Summary Table   Stim Site NR Peak (ms) Norm Peak (ms) P-T Amp (V) Norm P-T Amp Site1 Site2 Delta-P (ms) Dist (cm) Vel (m/s) Norm Vel (m/s)  Right Median Acr Palm Anti Sensory (2nd Digit)  31.3C  Wrist    *3.9 <3.6 29.6 >10 Wrist Palm 2.1 0.0    Palm    1.8 <2.0 41.9  Right Radial Anti Sensory (Base 1st Digit)  31.5C  Wrist    2.0 <3.1 30.1  Wrist Base 1st Digit 2.0 0.0    Right Ulnar Anti Sensory (5th Digit)  31.8C  Wrist    3.2 <3.7 32.5 >15.0 Wrist 5th Digit 3.2 14.0 44 >38   Motor Summary Table   Stim Site NR Onset (ms) Norm Onset (ms) O-P Amp (mV) Norm O-P Amp Site1 Site2 Delta-0 (ms) Dist (cm) Vel (m/s) Norm Vel (m/s)  Right Median Motor (Abd Poll Brev)  32C  Wrist    4.2 <4.2 *4.4 >5 Elbow Wrist  3.8 21.0 55 >50  Elbow    8.0  3.8         Right Ulnar Motor (Abd Dig Min)  32C  Wrist    3.0 <4.2 8.6 >3 B Elbow Wrist 3.3 18.0 55 >53  B Elbow    6.3  7.8  A Elbow B Elbow 0.9 10.0 111 >53  A Elbow    7.2  7.9          EMG   Side Muscle Nerve Root Ins Act Fibs Psw Amp Dur Poly Recrt Int Dennie Bible Comment  Right Abd Poll Brev Median C8-T1 Nml Nml Nml Nml Nml 0 Nml Nml   Right 1stDorInt Ulnar C8-T1 Nml Nml Nml Nml Nml 0 Nml Nml   Right PronatorTeres Median C6-7 Nml Nml Nml Nml Nml 0 Nml Nml   Right Biceps Musculocut C5-6 Nml Nml Nml Nml Nml 0 Nml Nml   Right Deltoid Axillary C5-6 Nml Nml Nml Nml Nml 0 Nml Nml     Nerve Conduction Studies Anti Sensory Left/Right Comparison   Stim Site L Lat (ms) R Lat (ms) L-R Lat (ms) L Amp (V) R Amp (V) L-R Amp (%) Site1 Site2 L Vel (m/s) R Vel (m/s) L-R Vel (m/s)  Median Acr Palm Anti Sensory (2nd Digit)  31.3C  Wrist  *3.9   29.6  Wrist Palm     Palm  1.8   41.9        Radial Anti Sensory (Base 1st Digit)  31.5C  Wrist  2.0   30.1  Wrist Base 1st Digit     Ulnar Anti Sensory (5th Digit)  31.8C  Wrist  3.2   32.5  Wrist 5th Digit  44    Motor Left/Right Comparison   Stim Site L Lat (ms) R Lat (ms) L-R Lat (ms) L Amp (mV) R Amp (mV) L-R Amp (%) Site1 Site2 L Vel (m/s) R Vel (m/s) L-R Vel (m/s)  Median Motor (Abd Poll Brev)  32C  Wrist  4.2   *4.4  Elbow Wrist  55   Elbow  8.0   3.8        Ulnar Motor (Abd Dig Min)  32C  Wrist  3.0   8.6  B Elbow Wrist  55   B Elbow  6.3   7.8  A Elbow B Elbow  111   A Elbow  7.2   7.9           Waveforms:            Clinical History: No specialty comments available.     Objective:  VS:  HT:    WT:   BMI:     BP:   HR: bpm  TEMP: ( )  RESP:  Physical Exam   Imaging: No results found.

## 2021-05-20 ENCOUNTER — Encounter: Payer: Self-pay | Admitting: *Deleted

## 2021-05-21 ENCOUNTER — Ambulatory Visit: Payer: 59 | Admitting: Orthopaedic Surgery

## 2021-08-10 IMAGING — MR MR HEAD W/O CM
11 of 12 series · 43 of 48 positions shown · non-contrast
Comparison: Head CT 09/19/2019 and brain MRI 05/20/2019

CLINICAL DATA: Encephalopathy

EXAM:
MRI HEAD WITHOUT CONTRAST
TECHNIQUE: Multiplanar, multiecho pulse sequences of the brain and surrounding
structures were obtained without intravenous contrast.

[Series 9: DWI · axial · 3.0mm · 0.88mm/px · z∈[-89,+53]mm · 9 of 98 slices shown (1 of 4)]
[im 1/98]
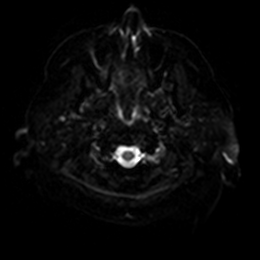
[im 13/98]
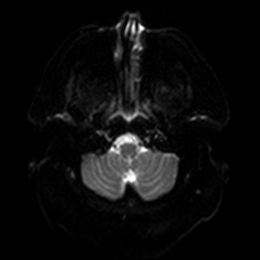
[im 25/98]
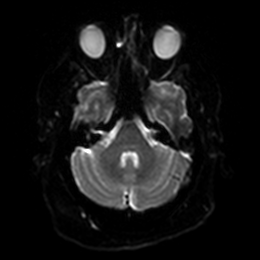
[im 37/98]
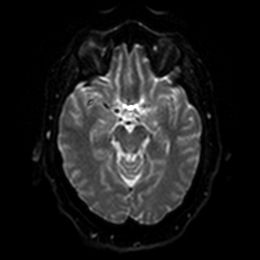
[im 49/98]
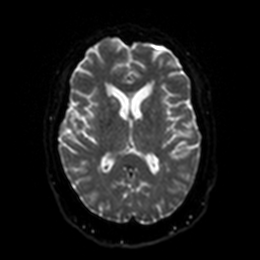
[im 61/98]
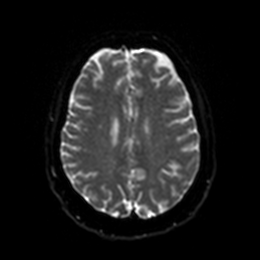
[im 73/98]
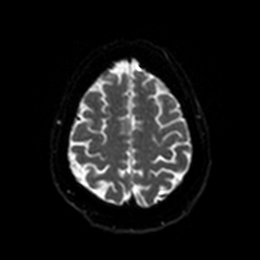
[im 85/98]
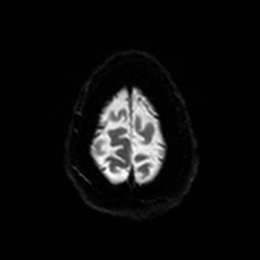
[im 98/98]
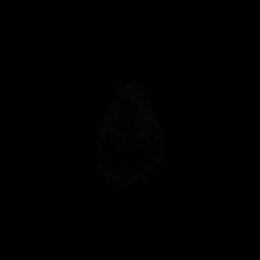

[Series 10: DWI · axial · 3.0mm · 0.88mm/px · z∈[-89,+53]mm · 5 of 48 slices shown (2 of 4)]
[im 1/48]
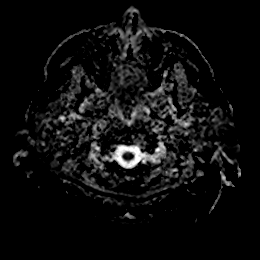
[im 12/48]
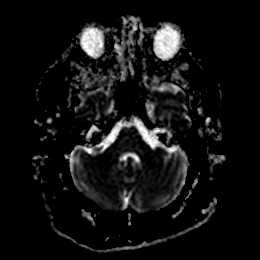
[im 24/48]
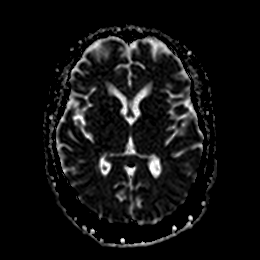
[im 36/48]
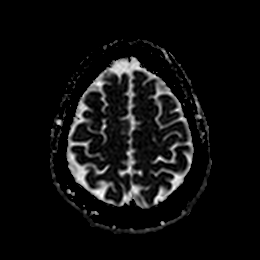
[im 48/48]
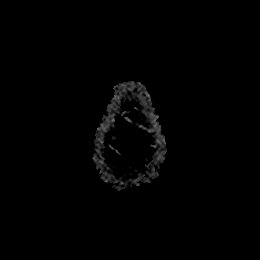

[Series 11: DWI · coronal · 4.0mm · 0.88mm/px · 6 of 66 slices shown (3 of 4)]
[im 1/66]
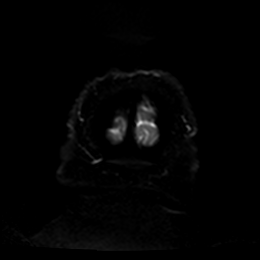
[im 14/66]
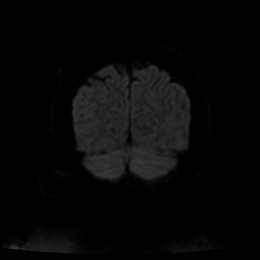
[im 27/66]
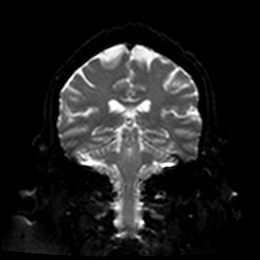
[im 40/66]
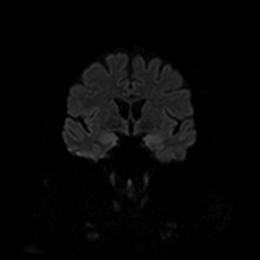
[im 53/66]
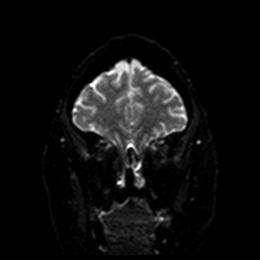
[im 66/66]
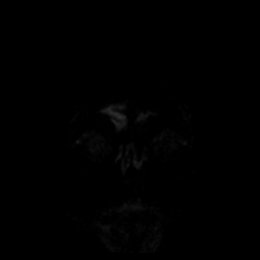

[Series 12: DWI · coronal · 4.0mm · 0.88mm/px · 3 of 33 slices shown (4 of 4)]
[im 1/33]
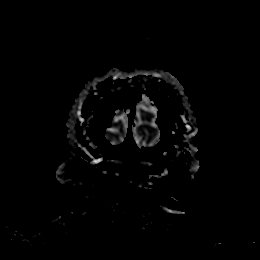
[im 17/33]
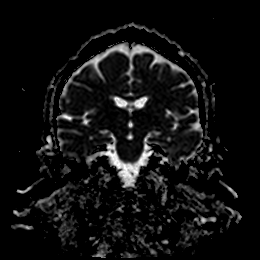
[im 33/33]
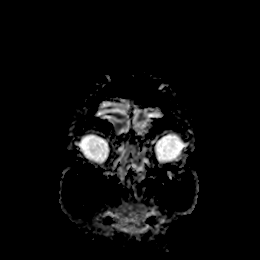

[Series 13: T1 · sagittal · 5.0mm · 0.75mm/px · 2 of 25 slices shown]
[im 1/25]
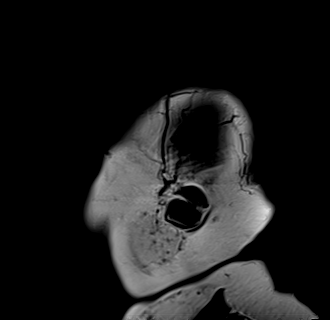
[im 25/25]
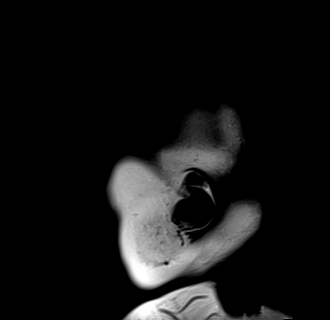

[Series 14: T2 · axial · 5.0mm · 0.72mm/px · z∈[-89,+53]mm · 2 of 25 slices shown (1 of 3)]
[im 1/25]
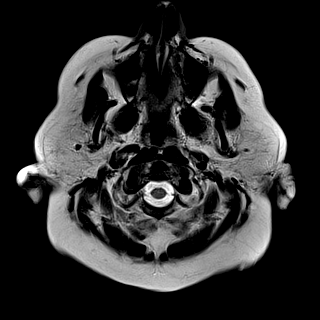
[im 25/25]
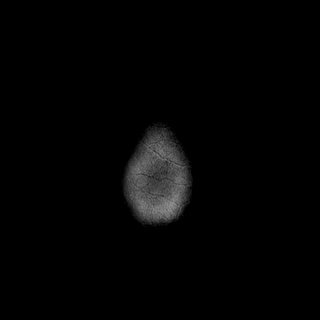

[Series 15: FLAIR · axial · 5.0mm · 0.45mm/px · z∈[-87,+55]mm · 2 of 25 slices shown]
[im 1/25]
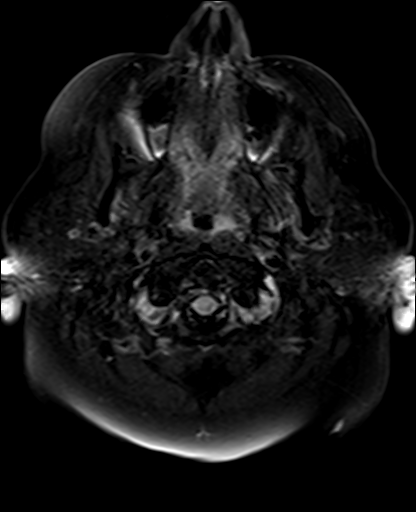
[im 25/25]
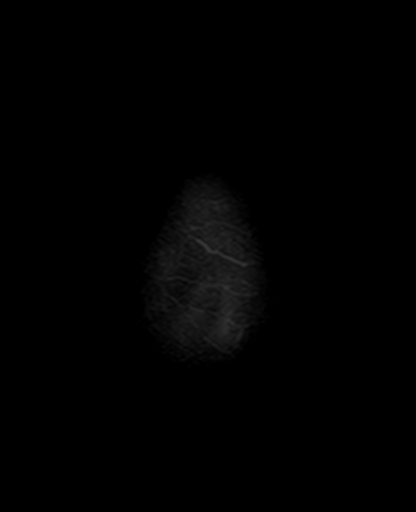

[Series 17: pha_images · axial · 3.0mm · 0.90mm/px · z∈[-104,+71]mm · 5 of 60 slices shown]
[im 1/60]
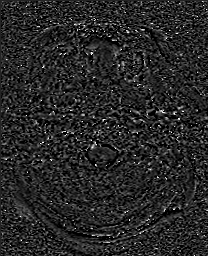
[im 15/60]
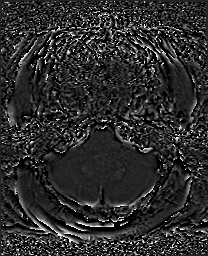
[im 30/60]
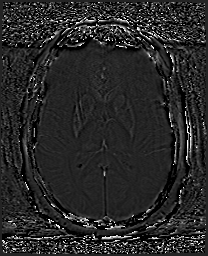
[im 45/60]
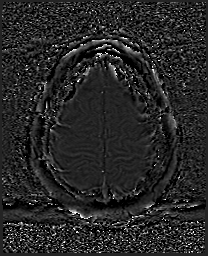
[im 60/60]
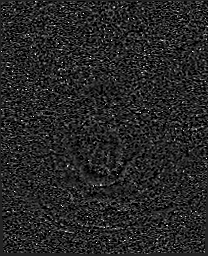

[Series 18: swi_images · axial · 3.0mm · 0.90mm/px · z∈[-104,+71]mm · 5 of 60 slices shown]
[im 1/60]
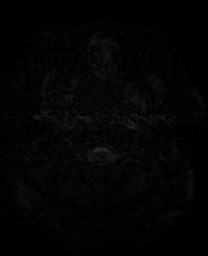
[im 15/60]
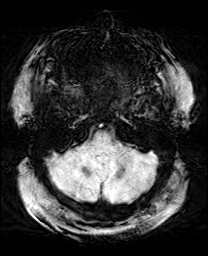
[im 30/60]
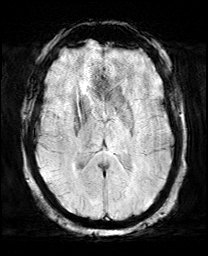
[im 45/60]
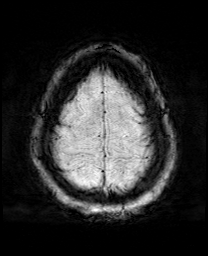
[im 60/60]
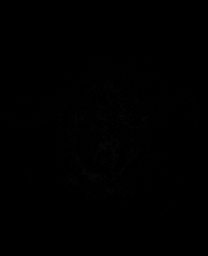

[Series 21: T2 · coronal · 5.0mm · 0.34mm/px · 2 of 29 slices shown (2 of 3)]
[im 1/29]
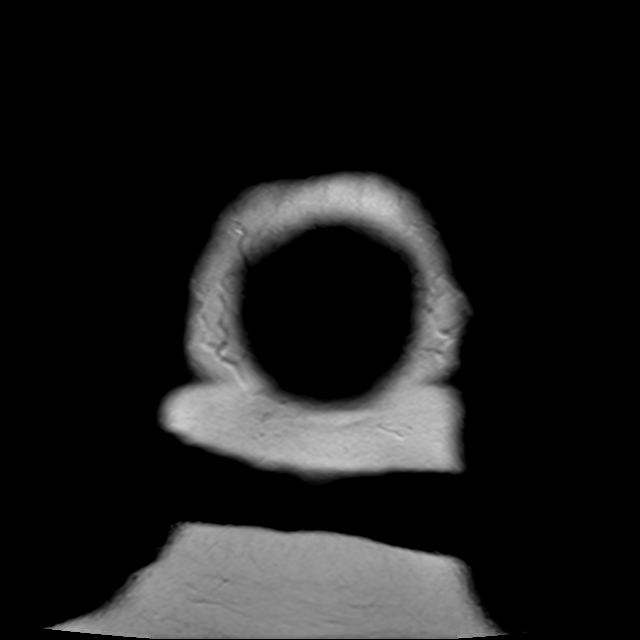
[im 29/29]
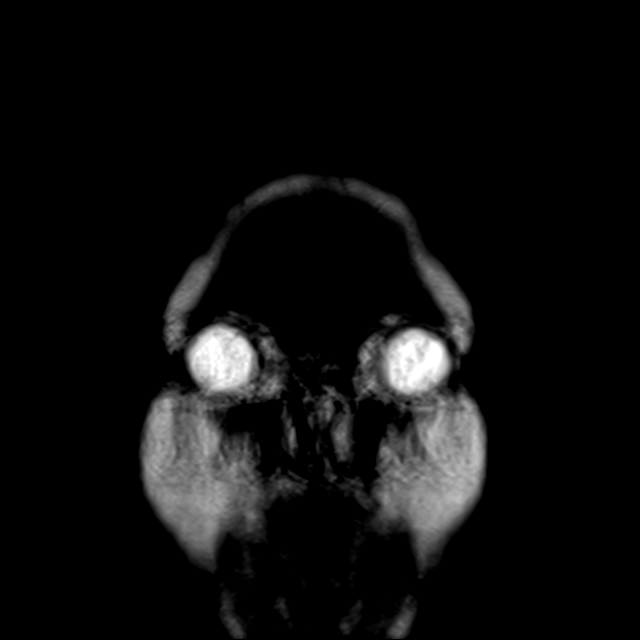

[Series 22: T2 · coronal · 5.0mm · 0.34mm/px · 2 of 29 slices shown (3 of 3)]
[im 1/29]
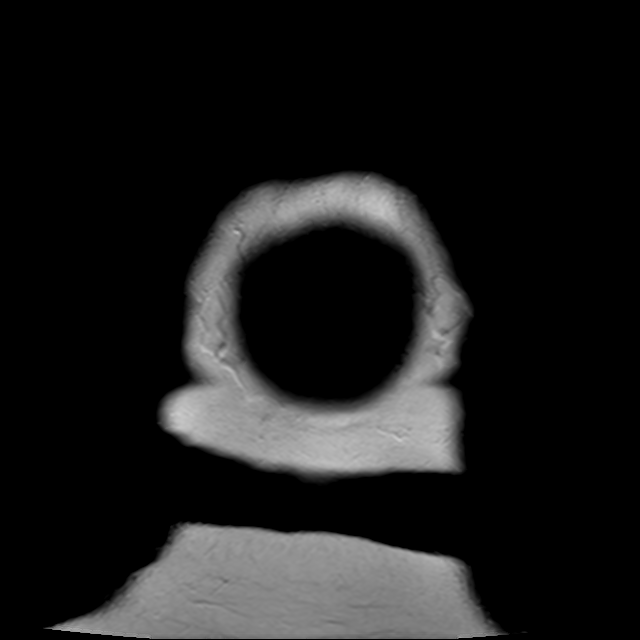
[im 29/29]
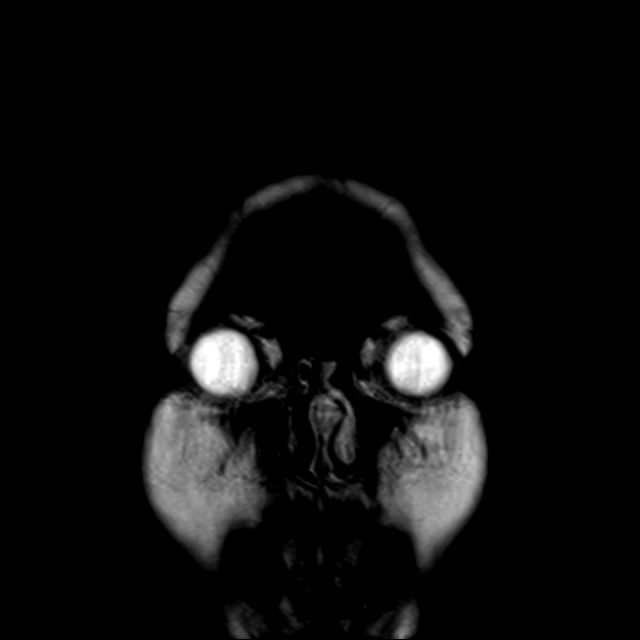

[43 of 48 positions shown; findings below may reference images not displayed]

FINDINGS: Brain: No acute infarct, acute hemorrhage or extra-axial collection.
Multifocal white matter hyperintensity, most commonly due to chronic
ischemic microangiopathy. Normal volume of CSF spaces. No chronic
microhemorrhage. Normal midline structures.

Vascular: Normal flow voids.

Skull and upper cervical spine: Normal marrow signal.

Sinuses/Orbits: Negative.

Other: None.
IMPRESSION: 1. No acute intracranial abnormality.
2. Multifocal white matter hyperintensity, most commonly due to
chronic ischemic microangiopathy.

## 2021-08-10 IMAGING — DX DG CHEST 1V PORT
1 series · 1 of 1 positions shown · non-contrast
Comparison: May 19, 2019

CLINICAL DATA: Altered mental status

EXAM:
PORTABLE CHEST 1 VIEW

[chest ap]
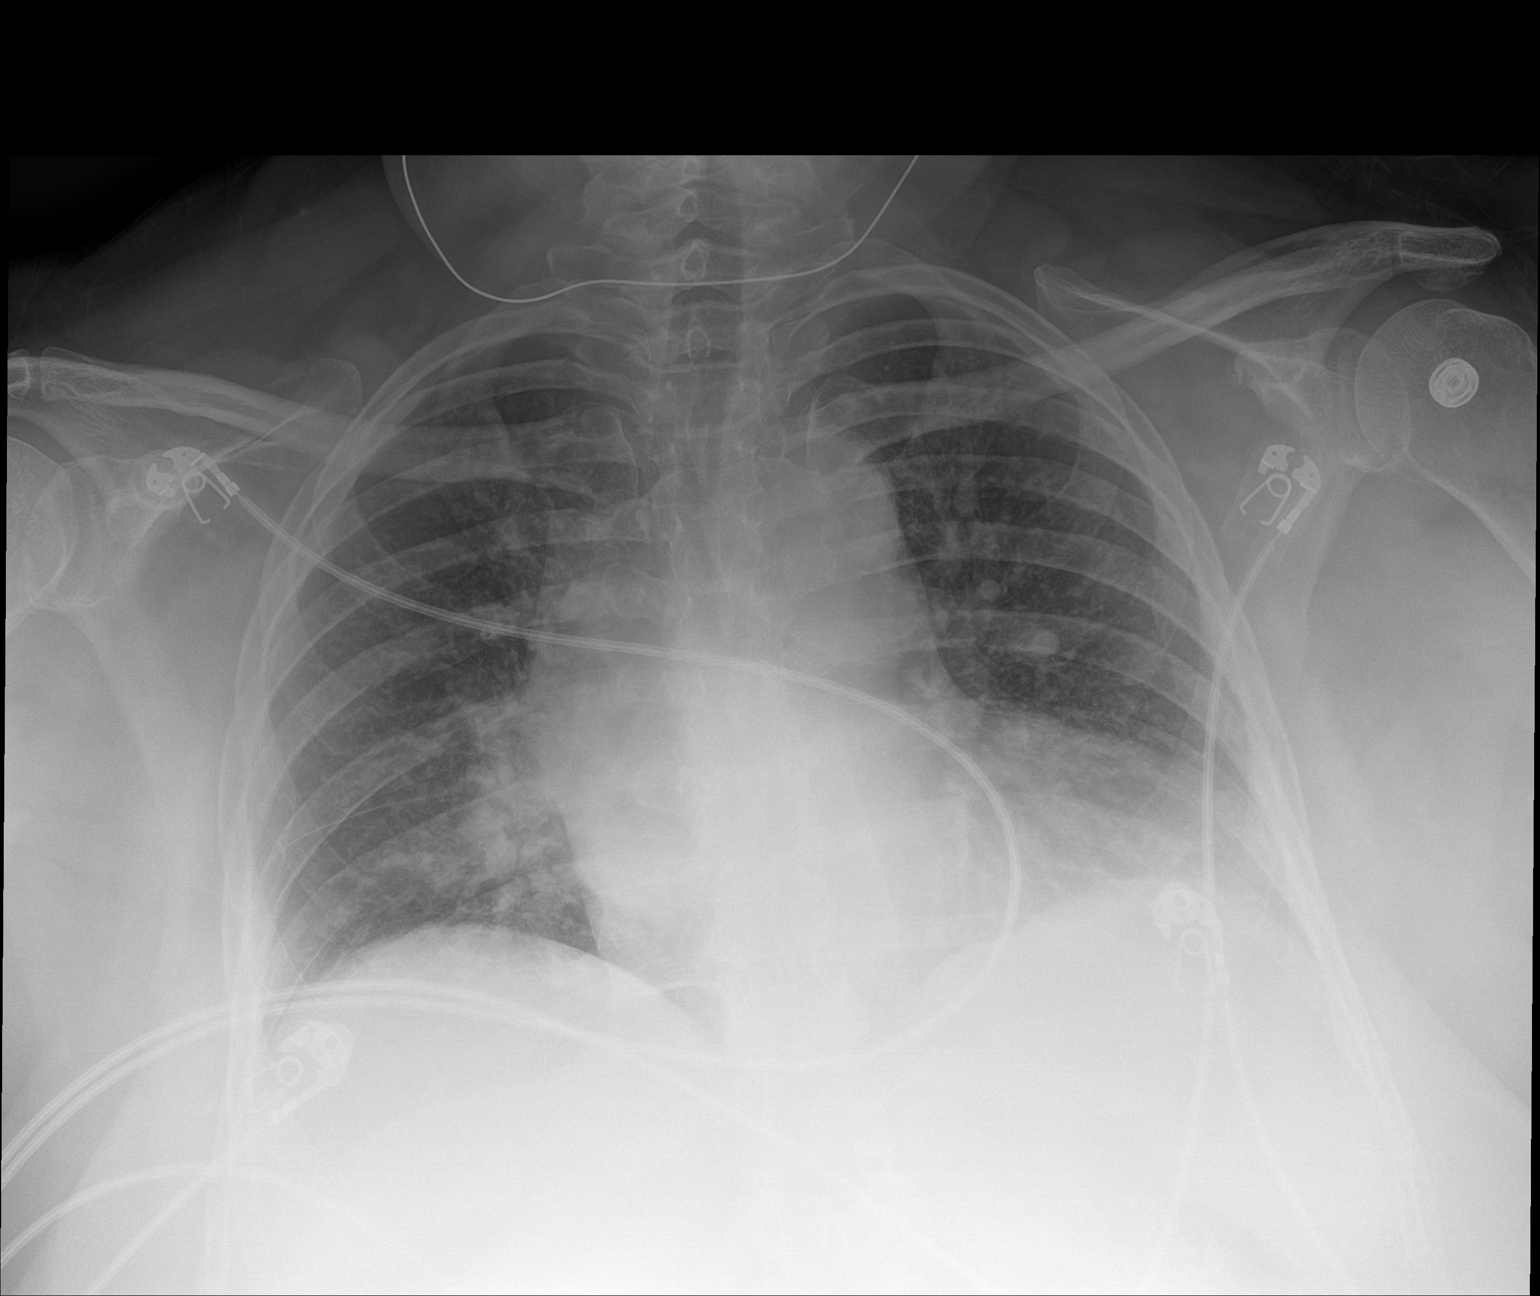

[1 of 1 positions shown; findings below may reference images not displayed]

FINDINGS: The cardiac silhouette Paulus N enlarged. Again noted are aortic
calcifications. There is vascular congestion without overt pulmonary
edema. There is no pneumothorax. No large pleural effusion. There is
no acute osseous abnormality.
IMPRESSION: Cardiomegaly and vascular congestion without overt pulmonary edema.

## 2021-08-10 IMAGING — CT CT HEAD W/O CM
5 of 8 series · 17 of 47 positions shown, 18 images · non-contrast
Comparison: Brain MRI 05/20/2019, head CT 05/18/2021

CLINICAL DATA: Altered mental status; neuro deficit, subacute.
Additional history provided: Altered mental status, decreased
intake, increased fatigue.

EXAM:
CT HEAD WITHOUT CONTRAST
TECHNIQUE: Contiguous axial images were obtained from the base of the skull
through the vertex without intravenous contrast.

[Series 3: head wo · axial · 0.44mm/px · z∈[-68,-14]mm · 2 of 33 slices shown, 3 images (1 of 2)]
[im 11/33  brain]
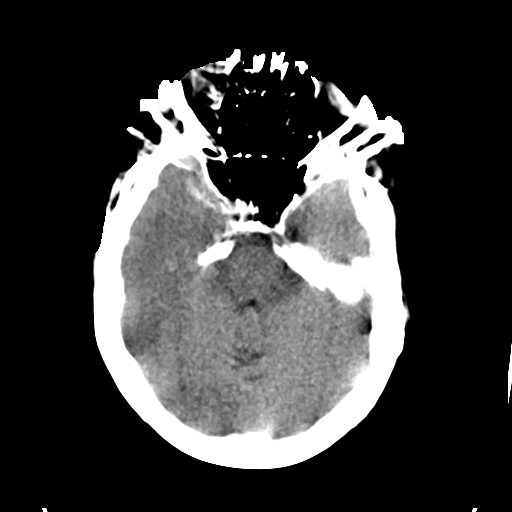
[im 11/33  bone]
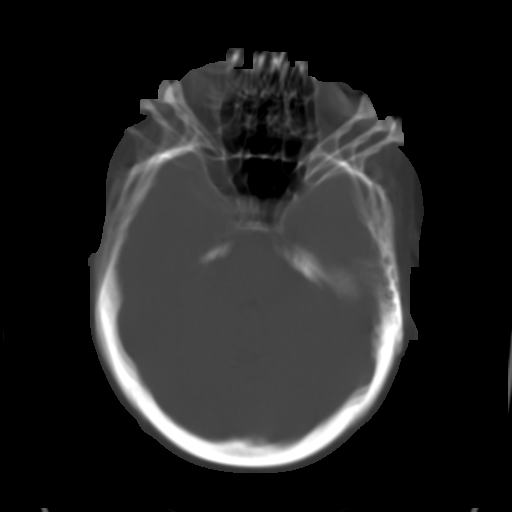
[im 22/33  brain]
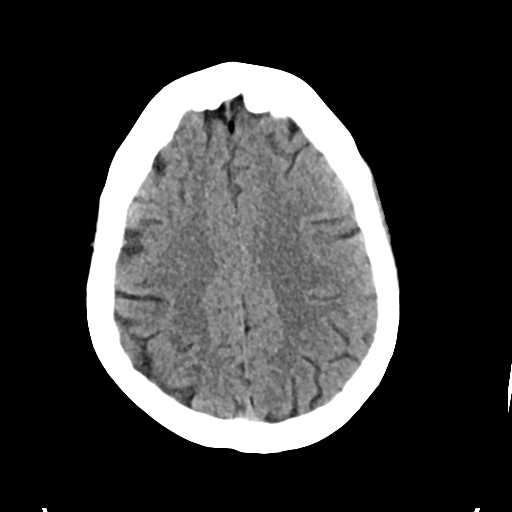

[Series 4: head bone · axial · 0.44mm/px · z∈[-102,+10]mm · 7 of 81 slices shown]
[im 9/81  bone]
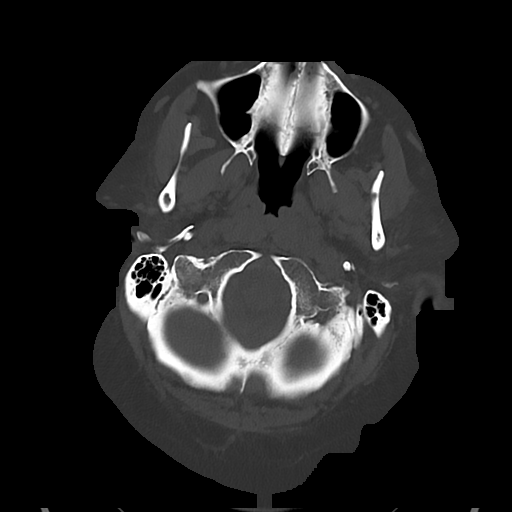
[im 17/81  bone]
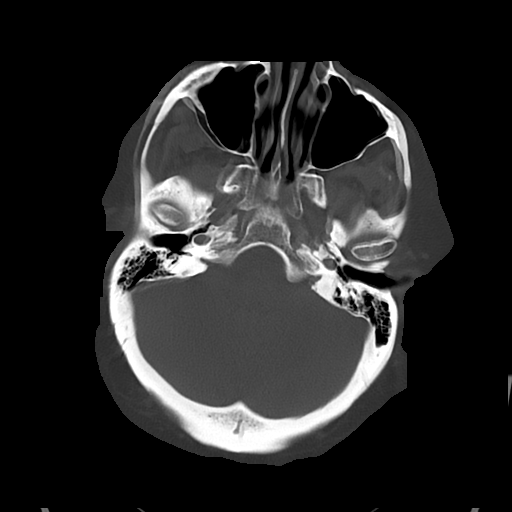
[im 25/81  bone]
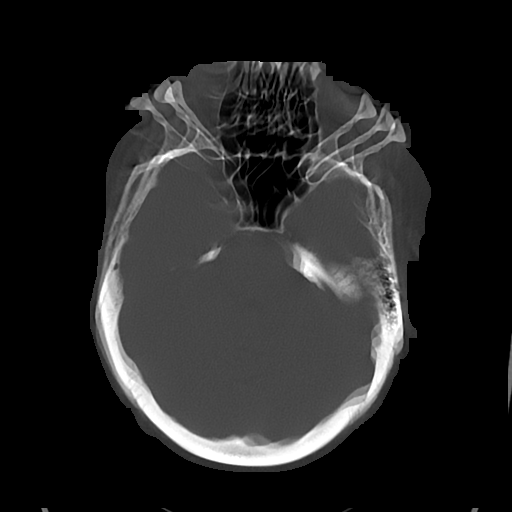
[im 33/81  bone]
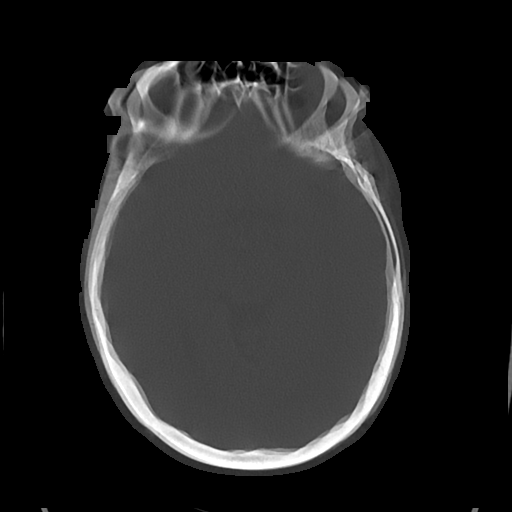
[im 49/81  bone]
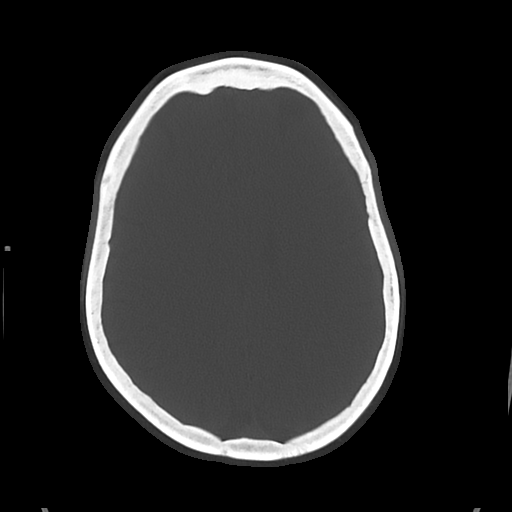
[im 57/81  bone]
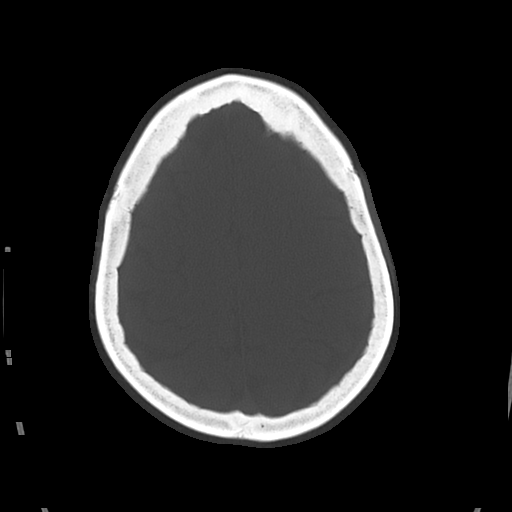
[im 65/81  bone]
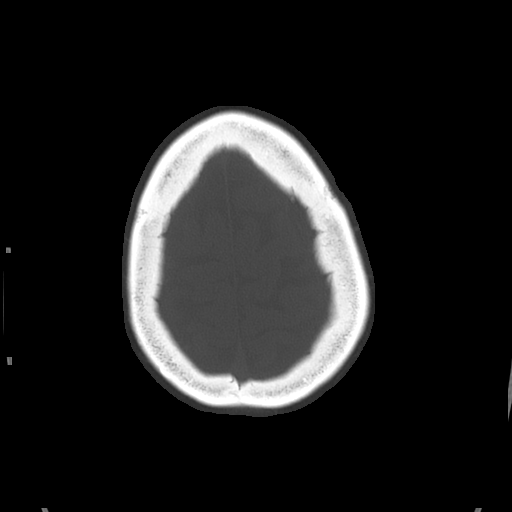

[Series 5: cor soft · coronal · 0.33mm/px · 3 of 75 slices shown]
[im 19/75  brain]
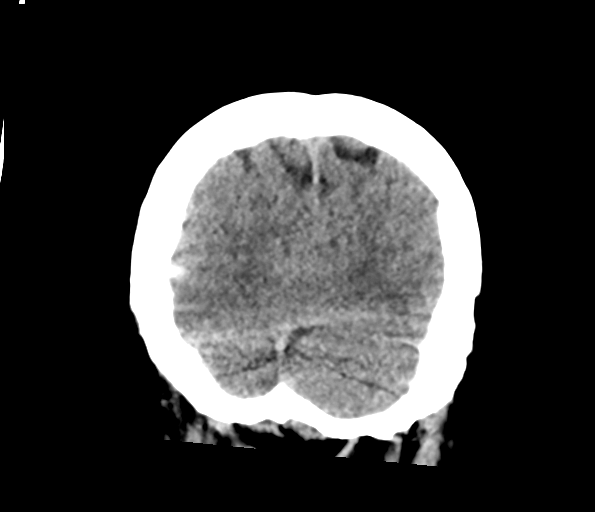
[im 38/75  brain]
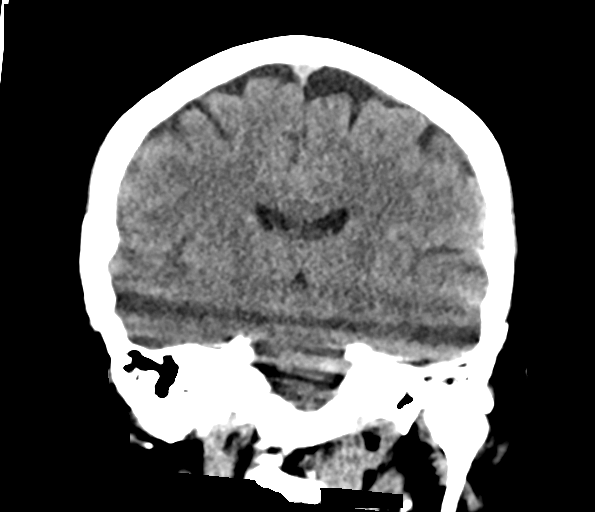
[im 56/75  brain]
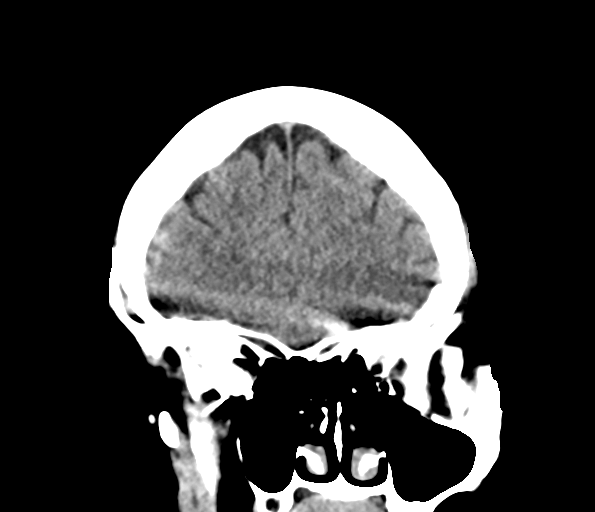

[Series 7: head wo · axial · 0.44mm/px · z∈[-68,-14]mm · 2 of 33 slices shown (2 of 2)]
[im 11/33  brain]
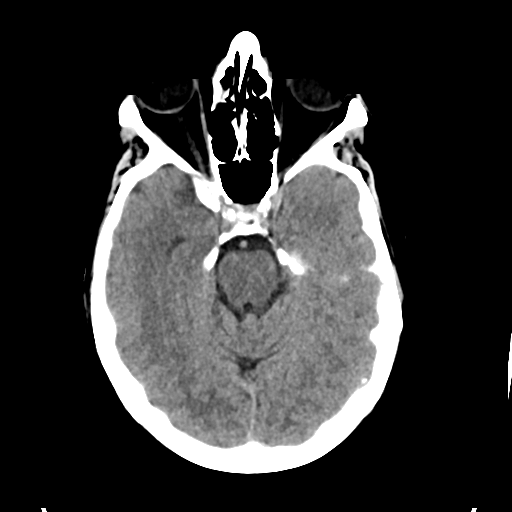
[im 22/33  brain]
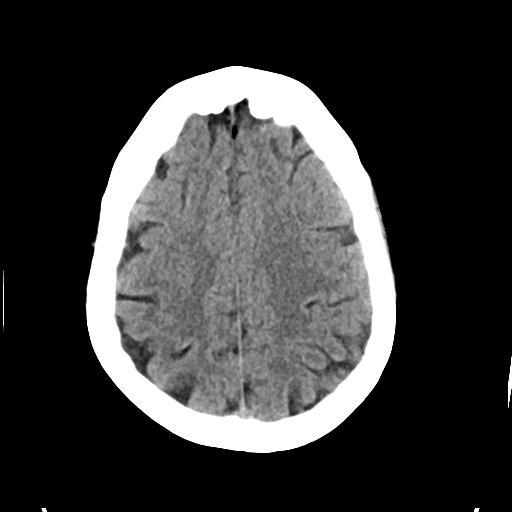

[Series 10: sag soft · sagittal · 0.33mm/px · 3 of 69 slices shown]
[im 12/69  brain]
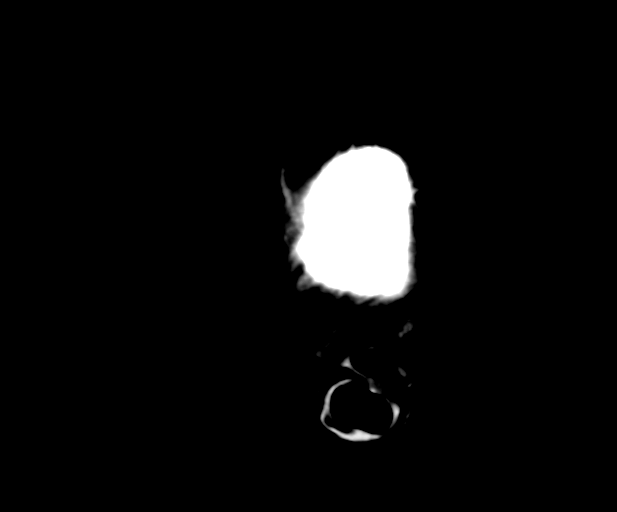
[im 31/69  brain]
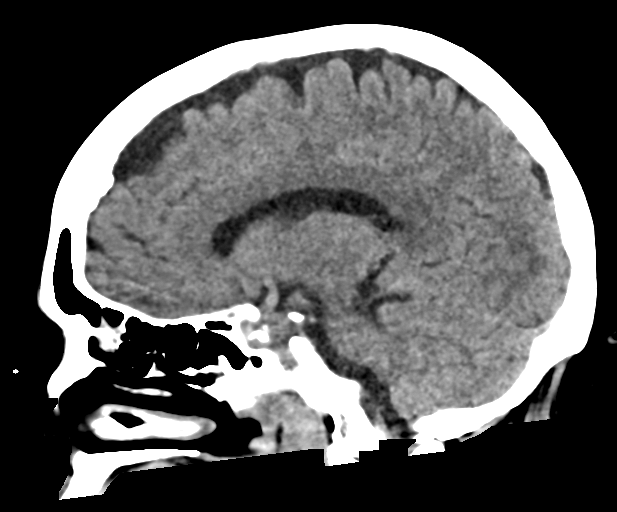
[im 50/69  brain]
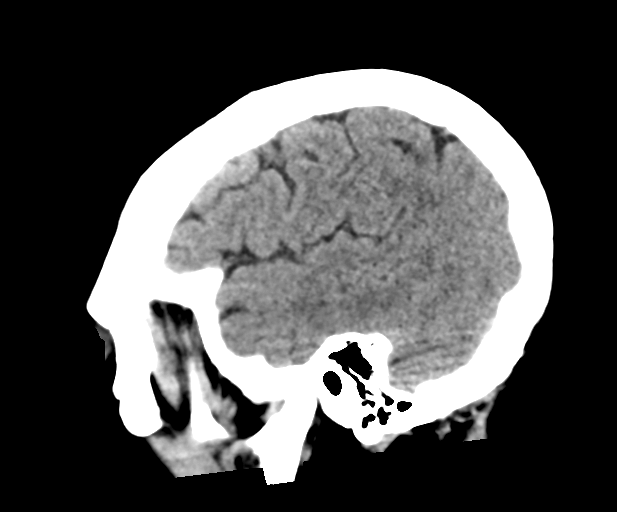

[17 of 47 positions shown; findings below may reference images not displayed]

FINDINGS: Brain: There is no evidence of acute intracranial hemorrhage,
intracranial mass, midline shift or extra-axial fluid collection.No
demarcated cortical infarction. Cerebral volume is normal.

Vascular: No hyperdense vessel. Atherosclerotic calcifications.

Skull: Normal. Negative for fracture or focal lesion.

Sinuses/Orbits: Visualized orbits demonstrate no acute abnormality.
Mild ethmoid sinus mucosal thickening. No significant mastoid
effusion.
IMPRESSION: No evidence of acute intracranial abnormality.

Mild ethmoid sinus mucosal thickening.

## 2023-01-01 ENCOUNTER — Encounter: Payer: Medicaid Other | Admitting: Urology

## 2023-01-08 ENCOUNTER — Ambulatory Visit: Payer: Medicare HMO | Admitting: Urology

## 2023-01-08 ENCOUNTER — Encounter: Payer: Self-pay | Admitting: Urology

## 2023-01-08 VITALS — BP 132/84 | HR 70 | Ht 63.0 in | Wt 210.0 lb

## 2023-01-08 DIAGNOSIS — N3941 Urge incontinence: Secondary | ICD-10-CM

## 2023-01-08 DIAGNOSIS — N8111 Cystocele, midline: Secondary | ICD-10-CM | POA: Diagnosis not present

## 2023-01-08 DIAGNOSIS — Z87440 Personal history of urinary (tract) infections: Secondary | ICD-10-CM

## 2023-01-08 LAB — BLADDER SCAN AMB NON-IMAGING

## 2023-01-08 MED ORDER — GEMTESA 75 MG PO TABS
75.0000 mg | ORAL_TABLET | Freq: Every day | ORAL | Status: DC
Start: 2023-01-08 — End: 2023-02-04

## 2023-01-08 NOTE — Progress Notes (Signed)
Assessment: 1. Urge incontinence   2. History of UTI   3. Cystocele, midline     Plan: I personally reviewed the patient's chart including provider notes, and lab results. I reviewed the patient's prior urology notes from [facility]. Continue daily Macrobid for UTI prevention. Trial of Gemtesa 75 mg daily.  Samples given. Return to office in 1 month. She is interested in possibly retreatment with PTNS.  Chief Complaint:  Chief Complaint  Patient presents with   Urinary Incontinence    History of Present Illness:  Judy Downs is a 62 y.o. female who is seen for evaluation of urge incontinence, history of UTIs, and cystocele with rectocele. She was previously followed at [facility] health urology in [facility].  She was last seen by me in April 2022.  Urologic History: She initially presented with a 3 month history of frequency, urgency, and urge incontinence. No SUI. No pad use at that time. She previously tried [place] without benefit. She was given a trial of Myrbetriq at her visit in March 2018 and noted significant improvement in her urinary symptoms with decreased frequency and urgency. She was not having any urge incontinence. No side effects from medication. She was changed to oxybutynin due to insurance coverage. She initially had good results with medication. She again noted worsening of her incontinence with urgency with standing and associated leakage. She was taking the oxybutynin 3 times daily without benefit. She was changed to trospium 20 mg BID in May 2019. She stopped the medication due to ineffectiveness. She continued with urge incontinence.  She was treated for UTI in 4/18. Urine culture grew Escherichia coli. She was treated with Macrobid 10 days. She has been treated for UTIs in 10/18, 3/19, and 4/19. Urine cultures as follows:  10/18 coag negative staph  3/19 Proteus  4/19 E. coli  10/19 E. coli.  She was treated with Cipro x3 days and started on  daily Macrobid for UTI prevention.  She discontinued the Macrobid.  Pelvic examination revealed a grade 2-3 cystocele and a rectocele.  Urodynamics from 04/13/2018 showed a normal bladder capacity, increased sensation of filling, no stress incontinence with or without reduction of her prolapse, low amplitude instability without leakage. She was able to void voluntarily with a bladder residual of 88 mL.  She was treated with posterior tibial nerve stimulation, completing 12 of 12 treatments in 2/20. She noted significant improvement with the treatments and was no longer having urge incontinence. She did not have urinary frequency or urgency. She was no longer taking Vesicare.  She presented in 10/20 with increased urinary symptoms of frequency, urgency, and urge incontinence. She was no longer taking Macrobid. Urine culture from 04/19/2019 grew >100 K E. coli. She was treated with Septra DS x7 days and restarted on daily Macrobid. She was admitted to [facility] in November 2020 with altered mental status. Her urinalysis on admission showed TNTC RBCs. Urine culture showed no significant growth. Blood cultures showed no growth as well. CT imaging showed no obvious abnormalities involving the kidneys, ureters, or bladder.  She was continued on daily Macrobid and Vesicare.  She was doing well at the time of her visit in February 2021.  At her visit in June 2021, she reported worsening of her incontinence symptoms. She was having frequent incontinence episodes with and without urgency. She had leakage with standing to go to bathroom. She reported some intermittent episodes of frequency, nocturia x 1, and urgency. She continued on Vesicare. She  was not on daily Macrobid stating this was discontinued during one of her hospitalizations. She was given a trial of Gemtesa 75 mg daily in June 2021. She did not see any improvement in her incontinence. She continued to have urgency and urge incontinence.   Urine culture from 01/12/2020 grew >100 K Klebsiella. She was treated with Bactrim DS and restarted on daily Macrobid.  Urodynamics from 02/14/2020 showed a normal bladder capacity with increased sensation of filling, no evidence of stress incontinence with or without reduction of prolapse, low amplitude instability with associated urgency but without obvious leakage.   At her visit in 10/21, she was not taking any medications for her incontinence. She continued on daily Macrobid at that time. She was noting some occasional discomfort in the urethra after voiding. She continued to have urgency and urge incontinence. She was not having any stress incontinence. Treatment options for her urge incontinence discussed including botulinum toxin therapy.  At her visit in April 2022, she continued to have urge incontinence, nocturia 3-4 times and urgency. She had not been on her daily Macrobid for at least 2 months. No recent UTI symptoms. She was not taking any medication for her incontinence.  She was seen by Dr. Sherryl Barters in November 2022.  Additional treatment with Botox was discussed.  She presents today to establish care.  She continues to have symptoms of frequency, urgency, nocturia x 3, and urge incontinence.  She is not using any pads.  She is not on any medication for her incontinence.  She continues on daily Macrodantin for UTI prevention.  No current UTI symptoms.  No dysuria or gross hematuria.  Portions of the above documentation were copied from a prior visit for review purposes only.    Past Medical History:  Past Medical History:  Diagnosis Date   Carpal tunnel syndrome    Chronic pain    Diabetes mellitus without complication (HCC)    GERD (gastroesophageal reflux disease)    Hyperlipidemia    Hypertension    OSA (obstructive sleep apnea)    Palpitation    SVT (supraventricular tachycardia)     Past Surgical History:  Past Surgical History:  Procedure Laterality Date   KNEE  ARTHROSCOPY     TONSILLECTOMY     TUBAL LIGATION      Allergies:  No Known Allergies  Family History:  History reviewed. No pertinent family history.  Social History:  Social History   Tobacco Use   Smoking status: Never   Smokeless tobacco: Never  Substance Use Topics   Alcohol use: Yes    Review of symptoms:  Constitutional:  Negative for unexplained weight loss, night sweats, fever, chills ENT:  Negative for nose bleeds, sinus pain, painful swallowing CV:  Negative for chest pain, shortness of breath, exercise intolerance, palpitations, loss of consciousness Resp:  Negative for cough, wheezing, shortness of breath GI:  Negative for nausea, vomiting, diarrhea, bloody stools GU:  Positives noted in HPI; otherwise negative for gross hematuria, dysuria Neuro:  Negative for seizures, poor balance, limb weakness, slurred speech Psych:  Negative for lack of energy, depression, anxiety Endocrine:  Negative for polydipsia, polyuria, symptoms of hypoglycemia (dizziness, hunger, sweating) Hematologic:  Negative for anemia, purpura, petechia, prolonged or excessive bleeding, use of anticoagulants  Allergic:  Negative for difficulty breathing or choking as a result of exposure to anything; no shellfish allergy; no allergic response (rash/itch) to materials, foods  Physical exam: BP 132/84   Pulse 70   Ht 5\' 3"  (1.6  m)   Wt 210 lb (95.3 kg)   BMI 37.20 kg/m  GENERAL APPEARANCE:  Well appearing, well developed, well nourished, NAD HEENT: Atraumatic, Normocephalic, oropharynx clear. NECK: Supple without lymphadenopathy or thyromegaly. LUNGS: Clear to auscultation bilaterally. HEART: Regular Rate and Rhythm without murmurs, gallops, or rubs. ABDOMEN: Soft, non-tender, No Masses. EXTREMITIES: Moves all extremities well.  Without clubbing, cyanosis, or edema. NEUROLOGIC:  Alert and oriented x 3, normal gait, CN II-XII grossly intact.  MENTAL STATUS:  Appropriate. BACK:  Non-tender  to palpation.  No CVAT SKIN:  Warm, dry and intact.    Results: U/A:  negative  PVR = 10 ml

## 2023-01-09 LAB — URINALYSIS, ROUTINE W REFLEX MICROSCOPIC
Bilirubin, UA: NEGATIVE
Glucose, UA: NEGATIVE
Ketones, UA: NEGATIVE
Leukocytes,UA: NEGATIVE
Nitrite, UA: NEGATIVE
Protein,UA: NEGATIVE
RBC, UA: NEGATIVE
Specific Gravity, UA: 1.02 (ref 1.005–1.030)
Urobilinogen, Ur: 0.2 mg/dL (ref 0.2–1.0)
pH, UA: 5.5 (ref 5.0–7.5)

## 2023-02-04 ENCOUNTER — Encounter: Payer: Self-pay | Admitting: Urology

## 2023-02-04 ENCOUNTER — Ambulatory Visit: Payer: Medicare HMO | Admitting: Urology

## 2023-02-04 VITALS — BP 123/77 | HR 87 | Ht 64.0 in | Wt 206.0 lb

## 2023-02-04 DIAGNOSIS — N8111 Cystocele, midline: Secondary | ICD-10-CM

## 2023-02-04 DIAGNOSIS — Z87440 Personal history of urinary (tract) infections: Secondary | ICD-10-CM | POA: Diagnosis not present

## 2023-02-04 DIAGNOSIS — N3941 Urge incontinence: Secondary | ICD-10-CM

## 2023-02-04 MED ORDER — GEMTESA 75 MG PO TABS: 75.0000 mg | ORAL_TABLET | Freq: Every day | ORAL | 11 refills | Status: DC

## 2023-02-04 NOTE — Progress Notes (Signed)
Assessment: 1. Urge incontinence   2. History of UTI   3. Cystocele, midline     Plan: Continue daily Macrobid for UTI prevention. Continue Gemtesa 75 mg daily.  Samples and rx given. She has previously failed treatment with oxybutynin, Vesicare, Myrbetriq, and Toviaz. Return to office in 3 months   Chief Complaint:  Chief Complaint  Patient presents with   Urinary Incontinence    History of Present Illness:  Judy Downs is a 62 y.o. female who is seen for evaluation of urge incontinence, history of UTIs, and cystocele with rectocele. She was previously followed at [facility] urology in [facility].  She was last seen by me in April 2022.  Urologic History: She initially presented with a 3 month history of frequency, urgency, and urge incontinence. No SUI. No pad use at that time. She previously tried [place] without benefit. She was given a trial of Myrbetriq at her visit in March 2018 and noted significant improvement in her urinary symptoms with decreased frequency and urgency. She was not having any urge incontinence. No side effects from medication. She was changed to oxybutynin due to insurance coverage. She initially had good results with medication. She again noted worsening of her incontinence with urgency with standing and associated leakage. She was taking the oxybutynin 3 times daily without benefit. She was changed to trospium 20 mg BID in May 2019. She stopped the medication due to ineffectiveness. She continued with urge incontinence.  She was treated for UTI in 4/18. Urine culture grew Escherichia coli. She was treated with Macrobid 10 days. She has been treated for UTIs in 10/18, 3/19, and 4/19. Urine cultures as follows:  10/18 coag negative staph  3/19 Proteus  4/19 E. coli  10/19 E. coli.  She was treated with Cipro x3 days and started on daily Macrobid for UTI prevention.  She discontinued the Macrobid.  Pelvic examination revealed a grade 2-3  cystocele and a rectocele.  Urodynamics from 04/13/2018 showed a normal bladder capacity, increased sensation of filling, no stress incontinence with or without reduction of her prolapse, low amplitude instability without leakage. She was able to void voluntarily with a bladder residual of 88 mL.  She was treated with posterior tibial nerve stimulation, completing 12 of 12 treatments in 2/20. She noted significant improvement with the treatments and was no longer having urge incontinence. She did not have urinary frequency or urgency. She was no longer taking Vesicare.  She presented in 10/20 with increased urinary symptoms of frequency, urgency, and urge incontinence. She was no longer taking Macrobid. Urine culture from 04/19/2019 grew >100 K E. coli. She was treated with Septra DS x7 days and restarted on daily Macrobid. She was admitted to [facility] in November 2020 with altered mental status. Her urinalysis on admission showed TNTC RBCs. Urine culture showed no significant growth. Blood cultures showed no growth as well. CT imaging showed no obvious abnormalities involving the kidneys, ureters, or bladder.  She was continued on daily Macrobid and Vesicare.  She was doing well at the time of her visit in February 2021.  At her visit in June 2021, she reported worsening of her incontinence symptoms. She was having frequent incontinence episodes with and without urgency. She had leakage with standing to go to bathroom. She reported some intermittent episodes of frequency, nocturia x 1, and urgency. She continued on Vesicare. She was not on daily Macrobid stating this was discontinued during one of her hospitalizations. She was given  a trial of Gemtesa 75 mg daily in June 2021. She did not see any improvement in her incontinence. She continued to have urgency and urge incontinence.  Urine culture from 01/12/2020 grew >100 K Klebsiella. She was treated with Bactrim DS and restarted on daily  Macrobid.  Urodynamics from 02/14/2020 showed a normal bladder capacity with increased sensation of filling, no evidence of stress incontinence with or without reduction of prolapse, low amplitude instability with associated urgency but without obvious leakage.   At her visit in 10/21, she was not taking any medications for her incontinence. She continued on daily Macrobid at that time. She was noting some occasional discomfort in the urethra after voiding. She continued to have urgency and urge incontinence. She was not having any stress incontinence. Treatment options for her urge incontinence discussed including botulinum toxin therapy.  At her visit in April 2022, she continued to have urge incontinence, nocturia 3-4 times and urgency. She had not been on her daily Macrobid for at least 2 months. No recent UTI symptoms. She was not taking any medication for her incontinence.  She was seen by Dr. Sherryl Barters in November 2022.  Additional treatment with Botox was discussed.  At her visit to establish care in July 2024, she continued to have symptoms of frequency, urgency, nocturia x 3, and urge incontinence.  She was not using any pads.  She was not on any medication for her incontinence.  She continued on daily Macrodantin for UTI prevention.  No UTI symptoms.  No dysuria or gross hematuria. U/A was negative.  PVR = 0 ml. She was given a trial of Gemtesa 75 mg daily.  She returns today for follow-up.  She has noted significant improvement in her frequency, urgency, nocturia, and urge incontinence with the Gemtesa.  No side effects from the medication.  She is very pleased with the salts of the medication.  She continues on daily Macrodantin for UTI prevention.  No current dysuria or gross hematuria.   Portions of the above documentation were copied from a prior visit for review purposes only.    Past Medical History:  Past Medical History:  Diagnosis Date   Carpal tunnel syndrome    Chronic  pain    Diabetes mellitus without complication (HCC)    GERD (gastroesophageal reflux disease)    Hyperlipidemia    Hypertension    OSA (obstructive sleep apnea)    Palpitation    SVT (supraventricular tachycardia)     Past Surgical History:  Past Surgical History:  Procedure Laterality Date   KNEE ARTHROSCOPY     TONSILLECTOMY     TUBAL LIGATION      Allergies:  No Known Allergies  Family History:  No family history on file.  Social History:  Social History   Tobacco Use   Smoking status: Never   Smokeless tobacco: Never  Substance Use Topics   Alcohol use: Yes    ROS: Constitutional:  Negative for fever, chills, weight loss CV: Negative for chest pain, previous MI, hypertension Respiratory:  Negative for shortness of breath, wheezing, sleep apnea, frequent cough GI:  Negative for nausea, vomiting, bloody stool, GERD  Physical exam: BP 123/77   Pulse 87   Ht 5\' 4"  (1.626 m)   Wt 206 lb (93.4 kg)   BMI 35.36 kg/m  GENERAL APPEARANCE:  Well appearing, well developed, well nourished, NAD HEENT:  Atraumatic, normocephalic, oropharynx clear NECK:  Supple without lymphadenopathy or thyromegaly ABDOMEN:  Soft, non-tender, no masses EXTREMITIES:  Moves all extremities well, without clubbing, cyanosis, or edema NEUROLOGIC:  Alert and oriented x 3, normal gait, CN II-XII grossly intact MENTAL STATUS:  appropriate BACK:  Non-tender to palpation, No CVAT SKIN:  Warm, dry, and intact  Results: U/A:  negative

## 2023-02-05 LAB — URINALYSIS, ROUTINE W REFLEX MICROSCOPIC
Bilirubin, UA: NEGATIVE
Glucose, UA: NEGATIVE
Ketones, UA: NEGATIVE
Leukocytes,UA: NEGATIVE
Nitrite, UA: NEGATIVE
Protein,UA: NEGATIVE
RBC, UA: NEGATIVE
Specific Gravity, UA: 1.02 (ref 1.005–1.030)
Urobilinogen, Ur: 0.2 mg/dL (ref 0.2–1.0)
pH, UA: 6 (ref 5.0–7.5)

## 2023-03-05 ENCOUNTER — Telehealth: Payer: Self-pay | Admitting: Urology

## 2023-03-05 NOTE — Telephone Encounter (Signed)
Scheduled pt for Monday. Thinks her bladder is falling out. Was unable to come in tomorrow.

## 2023-03-09 ENCOUNTER — Ambulatory Visit: Payer: Medicare HMO | Admitting: Urology

## 2023-03-09 ENCOUNTER — Encounter: Payer: Self-pay | Admitting: Urology

## 2023-03-09 VITALS — BP 133/83 | HR 74 | Ht 64.0 in | Wt 201.0 lb

## 2023-03-09 DIAGNOSIS — N819 Female genital prolapse, unspecified: Secondary | ICD-10-CM

## 2023-03-09 DIAGNOSIS — N3941 Urge incontinence: Secondary | ICD-10-CM | POA: Diagnosis not present

## 2023-03-09 DIAGNOSIS — Z87440 Personal history of urinary (tract) infections: Secondary | ICD-10-CM | POA: Diagnosis not present

## 2023-03-09 DIAGNOSIS — N8111 Cystocele, midline: Secondary | ICD-10-CM

## 2023-03-09 LAB — URINALYSIS, ROUTINE W REFLEX MICROSCOPIC
Bilirubin, UA: NEGATIVE
Glucose, UA: NEGATIVE
Ketones, UA: NEGATIVE
Leukocytes,UA: NEGATIVE
Nitrite, UA: NEGATIVE
Protein,UA: NEGATIVE
RBC, UA: NEGATIVE
Specific Gravity, UA: 1.015 (ref 1.005–1.030)
Urobilinogen, Ur: 0.2 mg/dL (ref 0.2–1.0)
pH, UA: 5.5 (ref 5.0–7.5)

## 2023-03-09 NOTE — Progress Notes (Signed)
Assessment: 1. Cystocele, midline   2. Female genital prolapse, unspecified type   3. Urge incontinence   4. History of UTI     Plan: I discussed the findings on physical exam with the patient today.  She appears to have some increase in her pelvic prolapse with associated cystocele, rectocele, and uterine prolapse.  Options for management discussed including observation, pessary use, and surgical repair.  She is interested in a possible surgical option. Continue daily Macrobid for UTI prevention. Continue Gemtesa 75 mg daily.   Refer to Cordella Register, MD at [facility] for discussion of surgical management of prolapse with cystocele and rectocele.  Chief Complaint:  Chief Complaint  Patient presents with   Urinary Incontinence    History of Present Illness:  Judy Downs is a 62 y.o. female who is seen for evaluation of urge incontinence, history of UTIs, and cystocele with rectocele. She was previously followed at [facility] urology in [facility].  She was last seen by me in April 2022.  Urologic History: She initially presented with a 3 month history of frequency, urgency, and urge incontinence. No SUI. No pad use at that time. She previously tried [place] without benefit. She was given a trial of Myrbetriq at her visit in March 2018 and noted significant improvement in her urinary symptoms with decreased frequency and urgency. She was not having any urge incontinence. No side effects from medication. She was changed to oxybutynin due to insurance coverage. She initially had good results with medication. She again noted worsening of her incontinence with urgency with standing and associated leakage. She was taking the oxybutynin 3 times daily without benefit. She was changed to trospium 20 mg BID in May 2019. She stopped the medication due to ineffectiveness. She continued with urge incontinence.  She was treated for UTI in 4/18. Urine culture grew Escherichia coli.  She was treated with Macrobid 10 days. She has been treated for UTIs in 10/18, 3/19, and 4/19. Urine cultures as follows:  10/18 coag negative staph  3/19 Proteus  4/19 E. coli  10/19 E. coli.  She was treated with Cipro x3 days and started on daily Macrobid for UTI prevention.  She discontinued the Macrobid.  Pelvic examination revealed a grade 2-3 cystocele and a rectocele.  Urodynamics from 04/13/2018 showed a normal bladder capacity, increased sensation of filling, no stress incontinence with or without reduction of her prolapse, low amplitude instability without leakage. She was able to void voluntarily with a bladder residual of 88 mL.  She was treated with posterior tibial nerve stimulation, completing 12 of 12 treatments in 2/20. She noted significant improvement with the treatments and was no longer having urge incontinence. She did not have urinary frequency or urgency. She was no longer taking Vesicare.  She presented in 10/20 with increased urinary symptoms of frequency, urgency, and urge incontinence. She was no longer taking Macrobid. Urine culture from 04/19/2019 grew >100 K E. coli. She was treated with Septra DS x7 days and restarted on daily Macrobid. She was admitted to [facility] in November 2020 with altered mental status. Her urinalysis on admission showed TNTC RBCs. Urine culture showed no significant growth. Blood cultures showed no growth as well. CT imaging showed no obvious abnormalities involving the kidneys, ureters, or bladder.  She was continued on daily Macrobid and Vesicare.  She was doing well at the time of her visit in February 2021.  At her visit in June 2021, she reported worsening of  her incontinence symptoms. She was having frequent incontinence episodes with and without urgency. She had leakage with standing to go to bathroom. She reported some intermittent episodes of frequency, nocturia x 1, and urgency. She continued on Vesicare. She was not on  daily Macrobid stating this was discontinued during one of her hospitalizations. She was given a trial of Gemtesa 75 mg daily in June 2021. She did not see any improvement in her incontinence. She continued to have urgency and urge incontinence.  Urine culture from 01/12/2020 grew >100 K Klebsiella. She was treated with Bactrim DS and restarted on daily Macrobid.  Urodynamics from 02/14/2020 showed a normal bladder capacity with increased sensation of filling, no evidence of stress incontinence with or without reduction of prolapse, low amplitude instability with associated urgency but without obvious leakage.   At her visit in 10/21, she was not taking any medications for her incontinence. She continued on daily Macrobid at that time. She was noting some occasional discomfort in the urethra after voiding. She continued to have urgency and urge incontinence. She was not having any stress incontinence. Treatment options for her urge incontinence discussed including botulinum toxin therapy.  At her visit in April 2022, she continued to have urge incontinence, nocturia 3-4 times and urgency. She had not been on her daily Macrobid for at least 2 months. No recent UTI symptoms. She was not taking any medication for her incontinence.  She was seen by Dr. Sherryl Barters in November 2022.  Additional treatment with Botox was discussed.  At her visit to establish care in July 2024, she continued to have symptoms of frequency, urgency, nocturia x 3, and urge incontinence.  She was not using any pads.  She was not on any medication for her incontinence.  She continued on daily Macrodantin for UTI prevention.  No UTI symptoms.  No dysuria or gross hematuria. U/A was negative.  PVR = 0 ml. She was given a trial of Gemtesa 75 mg daily. At her visit in 8/24, she had noted significant improvement in her frequency, urgency, nocturia, and urge incontinence with the Gemtesa.  No side effects from the medication.  She was very  pleased with the salts of the medication.  She continued on daily Macrodantin for UTI prevention.  No dysuria or gross hematuria.  She presents today for evaluation of her pelvic prolapse.  She has recently noted a increased bulge in the vaginal area.  She is having some discomfort associated with this.  Her urinary symptoms are stable on Gemtesa.  She continues with daily Macrobid and is not having any UTI symptoms.  Portions of the above documentation were copied from a prior visit for review purposes only.    Past Medical History:  Past Medical History:  Diagnosis Date   Carpal tunnel syndrome    Chronic pain    Diabetes mellitus without complication (HCC)    GERD (gastroesophageal reflux disease)    Hyperlipidemia    Hypertension    OSA (obstructive sleep apnea)    Palpitation    SVT (supraventricular tachycardia)     Past Surgical History:  Past Surgical History:  Procedure Laterality Date   KNEE ARTHROSCOPY     TONSILLECTOMY     TUBAL LIGATION      Allergies:  No Known Allergies  Family History:  History reviewed. No pertinent family history.  Social History:  Social History   Tobacco Use   Smoking status: Never   Smokeless tobacco: Never  Substance Use Topics  Alcohol use: Yes    ROS: Constitutional:  Negative for fever, chills, weight loss CV: Negative for chest pain, previous MI, hypertension Respiratory:  Negative for shortness of breath, wheezing, sleep apnea, frequent cough GI:  Negative for nausea, vomiting, bloody stool, GERD  Physical exam: BP 133/83   Pulse 74   Ht 5\' 4"  (1.626 m)   Wt 201 lb (91.2 kg)   BMI 34.50 kg/m  GENERAL APPEARANCE:  Well appearing, well developed, well nourished, NAD HEENT:  Atraumatic, normocephalic, oropharynx clear NECK:  Supple without lymphadenopathy or thyromegaly ABDOMEN:  Soft, non-tender, no masses EXTREMITIES:  Moves all extremities well, without clubbing, cyanosis, or edema NEUROLOGIC:  Alert and  oriented x 3, normal gait, CN II-XII grossly intact MENTAL STATUS:  appropriate BACK:  Non-tender to palpation, No CVAT SKIN:  Warm, dry, and intact GU: Urethra: no leakage noted; no mass Vagina: grade 3 cystocele; grade 2 rectocele; uterine prolapse noted  A chaperone was present during the examination.  Results: U/A:negative

## 2023-03-20 ENCOUNTER — Other Ambulatory Visit: Payer: Self-pay | Admitting: Urology

## 2023-03-25 ENCOUNTER — Other Ambulatory Visit: Payer: Self-pay | Admitting: Urology

## 2023-03-25 ENCOUNTER — Telehealth: Payer: Self-pay | Admitting: Urology

## 2023-03-25 MED ORDER — NITROFURANTOIN MACROCRYSTAL 100 MG PO CAPS: 100.0000 mg | ORAL_CAPSULE | Freq: Every day | ORAL | 3 refills | Status: DC

## 2023-03-25 NOTE — Telephone Encounter (Signed)
  nitrofurantoin (MACRODANTIN) 100 MG capsule   Needs refill on medication.

## 2023-03-31 ENCOUNTER — Telehealth: Payer: Self-pay | Admitting: Urology

## 2023-03-31 NOTE — Telephone Encounter (Signed)
Called and LVM w/ pt to get cath removed

## 2023-04-13 NOTE — Patient Instructions (Addendum)
SURGICAL WAITING ROOM VISITATION  Patients having surgery or a procedure may have no more than 2 support people in the waiting area - these visitors may rotate.    Children under the age of 64 must have an adult with them who is not the patient.  Due to an increase in RSV and influenza rates and associated hospitalizations, children ages 21 and under may not visit patients in [facility] hospitals.  If the patient needs to stay at the hospital during part of their recovery, the visitor guidelines for inpatient rooms apply. Pre-op nurse will coordinate an appropriate time for 1 support person to accompany patient in pre-op.  This support person may not rotate.    Please refer to the [facility] website for the visitor guidelines for Inpatients (after your surgery is over and you are in a regular room).       Your procedure is scheduled on: 04/24/23   Report to [facility] Main Entrance    Report to admitting at 5:15 AM   Call this number if you have problems the morning of surgery [phone]   Do not eat food  or drink liquids :After Midnight.     FOLLOW BOWEL PREP AND ANY ADDITIONAL PRE OP INSTRUCTIONS YOU RECEIVED FROM YOUR SURGEON'S OFFICE!!!     Oral Hygiene is also important to reduce your risk of infection.                                    Remember - BRUSH YOUR TEETH THE MORNING OF SURGERY WITH YOUR REGULAR TOOTHPASTE  DENTURES WILL BE REMOVED PRIOR TO SURGERY PLEASE DO NOT APPLY "Poly grip" OR ADHESIVES!!!   Stop all vitamins and herbal supplements 7 days before surgery.   Take these medicines the morning of surgery with A SIP OF WATER:   DO NOT TAKE ANY ORAL DIABETIC MEDICATIONS DAY OF YOUR SURGERY: hold Metformin the day of surgery.  Bring CPAP mask and tubing day of surgery.                              You may not have any metal on your body including hair pins, jewelry, and body piercing             Do not wear make-up, lotions, powders,  perfumes/cologne, or deodorant  Do not wear nail polish including gel and S&S, artificial/acrylic nails, or any other type of covering on natural nails including finger and toenails. If you have artificial nails, gel coating, etc. that needs to be removed by a nail salon please have this removed prior to surgery or surgery may need to be canceled/ delayed if the surgeon/ anesthesia feels like they are unable to be safely monitored.   Do not shave  48 hours prior to surgery.               Men may shave face and neck.   Do not bring valuables to the hospital. [facility] IS NOT             RESPONSIBLE   FOR VALUABLES.   Contacts, glasses, dentures or bridgework may not be worn into surgery.   Bring small overnight bag day of surgery.   DO NOT BRING YOUR HOME MEDICATIONS TO THE HOSPITAL. PHARMACY WILL DISPENSE MEDICATIONS LISTED ON YOUR MEDICATION LIST TO YOU DURING YOUR ADMISSION IN  THE HOSPITAL!    Patients discharged on the day of surgery will not be allowed to drive home.  Someone NEEDS to stay with you for the first 24 hours after anesthesia.   Special Instructions: Bring a copy of your healthcare power of attorney and living will documents the day of surgery if you haven't scanned them before.              Please read over the following fact sheets you were given: IF YOU HAVE QUESTIONS ABOUT YOUR PRE-OP INSTRUCTIONS PLEASE CALL [phone] Judy Downs   If you received a COVID test during your pre-op visit  it is requested that you wear a mask when out in public, stay away from anyone that may not be feeling well and notify your surgeon if you develop symptoms. If you test positive for Covid or have been in contact with anyone that has tested positive in the last 10 days please notify you surgeon.    [facility] - Preparing for Surgery Before surgery, you can play an important role.  Because skin is not sterile, your skin needs to be as free of germs as possible.  You can reduce the number  of germs on your skin by washing with CHG (chlorahexidine gluconate) soap before surgery.  CHG is an antiseptic cleaner which kills germs and bonds with the skin to continue killing germs even after washing. Please DO NOT use if you have an allergy to CHG or antibacterial soaps.  If your skin becomes reddened/irritated stop using the CHG and inform your nurse when you arrive at Short Stay. Do not shave (including legs and underarms) for at least 48 hours prior to the first CHG shower.  You may shave your face/neck.  Please follow these instructions carefully:  1.  Shower with CHG Soap the night before surgery and the  morning of surgery.  2.  If you choose to wash your hair, wash your hair first as usual with your normal  shampoo.  3.  After you shampoo, rinse your hair and body thoroughly to remove the shampoo.                             4.  Use CHG as you would any other liquid soap.  You can apply chg directly to the skin and wash.  Gently with a scrungie or clean washcloth.  5.  Apply the CHG Soap to your body ONLY FROM THE NECK DOWN.   Do   not use on face/ open                           Wound or open sores. Avoid contact with eyes, ears mouth and   genitals (private parts).                       Wash face,  Genitals (private parts) with your normal soap.             6.  Wash thoroughly, paying special attention to the area where your    surgery  will be performed.  7.  Thoroughly rinse your body with warm water from the neck down.  8.  DO NOT shower/wash with your normal soap after using and rinsing off the CHG Soap.                9.  Pat yourself dry with  a clean towel.            10.  Wear clean pajamas.            11.  Place clean sheets on your bed the night of your first shower and do not  sleep with pets. Day of Surgery : Do not apply any lotions/deodorants the morning of surgery.  Please wear clean clothes to the hospital/surgery center.  FAILURE TO FOLLOW THESE INSTRUCTIONS MAY  RESULT IN THE CANCELLATION OF YOUR SURGERY  PATIENT SIGNATURE_________________________________  NURSE SIGNATURE__________________________________  ________________________________________________________________________ WHAT IS A BLOOD TRANSFUSION? Blood Transfusion Information  A transfusion is the replacement of blood or some of its parts. Blood is made up of multiple cells which provide different functions. Red blood cells carry oxygen and are used for blood loss replacement. White blood cells fight against infection. Platelets control bleeding. Plasma helps clot blood. Other blood products are available for specialized needs, such as hemophilia or other clotting disorders. BEFORE THE TRANSFUSION  Who gives blood for transfusions?  Healthy volunteers who are fully evaluated to make sure their blood is safe. This is blood bank blood. Transfusion therapy is the safest it has ever been in the practice of medicine. Before blood is taken from a donor, a complete history is taken to make sure that person has no history of diseases nor engages in risky social behavior (examples are intravenous drug use or sexual activity with multiple partners). The donor's travel history is screened to minimize risk of transmitting infections, such as malaria. The donated blood is tested for signs of infectious diseases, such as HIV and hepatitis. The blood is then tested to be sure it is compatible with you in order to minimize the chance of a transfusion reaction. If you or a relative donates blood, this is often done in anticipation of surgery and is not appropriate for emergency situations. It takes many days to process the donated blood. RISKS AND COMPLICATIONS Although transfusion therapy is very safe and saves many lives, the main dangers of transfusion include:  Getting an infectious disease. Developing a transfusion reaction. This is an allergic reaction to something in the blood you were given. Every  precaution is taken to prevent this. The decision to have a blood transfusion has been considered carefully by your caregiver before blood is given. Blood is not given unless the benefits outweigh the risks. AFTER THE TRANSFUSION Right after receiving a blood transfusion, you will usually feel much better and more energetic. This is especially true if your red blood cells have gotten low (anemic). The transfusion raises the level of the red blood cells which carry oxygen, and this usually causes an energy increase. The nurse administering the transfusion will monitor you carefully for complications. HOME CARE INSTRUCTIONS  No special instructions are needed after a transfusion. You may find your energy is better. Speak with your caregiver about any limitations on activity for underlying diseases you may have. SEEK MEDICAL CARE IF:  Your condition is not improving after your transfusion. You develop redness or irritation at the intravenous (IV) site. SEEK IMMEDIATE MEDICAL CARE IF:  Any of the following symptoms occur over the next 12 hours: Shaking chills. You have a temperature by mouth above 102 F (38.9 C), not controlled by medicine. Chest, back, or muscle pain. People around you feel you are not acting correctly or are confused. Shortness of breath or difficulty breathing. Dizziness and fainting. You get a rash or develop hives. You have a decrease  in urine output. Your urine turns a dark color or changes to pink, red, or brown. Any of the following symptoms occur over the next 10 days: You have a temperature by mouth above 102 F (38.9 C), not controlled by medicine. Shortness of breath. Weakness after normal activity. The white part of the eye turns yellow (jaundice). You have a decrease in the amount of urine or are urinating less often. Your urine turns a dark color or changes to pink, red, or brown. Document Released: 06/06/2000 Document Revised: 09/01/2011 Document Reviewed:  01/24/2008 [facility] Patient Information 2014 [place], [place].

## 2023-04-13 NOTE — Progress Notes (Signed)
COVID Vaccine received:  []  No [x]  Yes Date of any COVID positive Test in last 90 days: no PCP - Laureen Ochs FNP Cardiologist - no  Chest x-ray -  EKG -  04/14/23 Epic Stress Test -  ECHO -  Cardiac Cath -   Bowel Prep - []  No  [x]   Yes ______  Pacemaker / ICD device [x]  No []  Yes   Spinal Cord Stimulator:[x]  No []  Yes       History of Sleep Apnea? [x]  No []  Yes   CPAP used?- [x]  No []  Yes    Does the patient monitor blood sugar?          [x]  No []  Yes  []  N/A  Patient has: []  NO Hx DM   []  Pre-DM                 []  DM1  [x]   DM2 Does patient have a [employer] or Dexacom? []  No []  Yes   Fasting Blood Sugar Ranges-  Checks Blood Sugar ____0_ times a day  GLP1 agonist / usual dose - no GLP1 instructions:  SGLT-2 inhibitors / usual dose - no SGLT-2 instructions:   Blood Thinner / Instructions:no Aspirin Instructions:no  Comments:   Activity level: Patient is able  to climb a flight of stairs without difficulty; [x]  No CP  [x]  No SOB, _   Patient can  perform ADLs without assistance.   Anesthesia review:   Patient denies shortness of breath, fever, cough and chest pain at PAT appointment.  Patient verbalized understanding and agreement to the Pre-Surgical Instructions that were given to them at this PAT appointment. Patient was also educated of the need to review these PAT instructions again prior to his/her surgery.I reviewed the appropriate phone numbers to call if they have any and questions or concerns.

## 2023-04-14 ENCOUNTER — Encounter (HOSPITAL_COMMUNITY): Payer: Self-pay

## 2023-04-14 ENCOUNTER — Encounter (HOSPITAL_COMMUNITY)
Admission: RE | Admit: 2023-04-14 | Discharge: 2023-04-14 | Disposition: A | Discharge status: Home / Self Care | Payer: Medicare HMO | Source: Ambulatory Visit | Attending: Urology | Admitting: Urology

## 2023-04-14 ENCOUNTER — Other Ambulatory Visit: Payer: Self-pay

## 2023-04-14 VITALS — BP 134/76 | HR 70 | Temp 98.1°F | Resp 16 | Ht 64.0 in | Wt 205.0 lb

## 2023-04-14 DIAGNOSIS — Z01810 Encounter for preprocedural cardiovascular examination: Secondary | ICD-10-CM | POA: Diagnosis present

## 2023-04-14 DIAGNOSIS — R9431 Abnormal electrocardiogram [ECG] [EKG]: Secondary | ICD-10-CM | POA: Diagnosis not present

## 2023-04-14 DIAGNOSIS — E119 Type 2 diabetes mellitus without complications: Secondary | ICD-10-CM | POA: Diagnosis not present

## 2023-04-14 DIAGNOSIS — Z01818 Encounter for other preprocedural examination: Secondary | ICD-10-CM | POA: Diagnosis not present

## 2023-04-14 DIAGNOSIS — I10 Essential (primary) hypertension: Secondary | ICD-10-CM

## 2023-04-14 DIAGNOSIS — Z01812 Encounter for preprocedural laboratory examination: Secondary | ICD-10-CM | POA: Diagnosis present

## 2023-04-14 DIAGNOSIS — Z118 Encounter for screening for other infectious and parasitic diseases: Secondary | ICD-10-CM | POA: Diagnosis not present

## 2023-04-14 HISTORY — DX: Depression, unspecified: F32.A

## 2023-04-14 HISTORY — DX: Unspecified osteoarthritis, unspecified site: M19.90

## 2023-04-14 HISTORY — DX: Anxiety disorder, unspecified: F41.9

## 2023-04-14 LAB — CBC
HCT: 43.3 % (ref 36.0–46.0)
Hemoglobin: 13.9 g/dL (ref 12.0–15.0)
MCH: 30.9 pg (ref 26.0–34.0)
MCHC: 32.1 g/dL (ref 30.0–36.0)
MCV: 96.2 fL (ref 80.0–100.0)
Platelets: 210 10*3/uL (ref 150–400)
RBC: 4.5 MIL/uL (ref 3.87–5.11)
RDW: 12.4 % (ref 11.5–15.5)
WBC: 5.3 10*3/uL (ref 4.0–10.5)
nRBC: 0 % (ref 0.0–0.2)

## 2023-04-14 LAB — COMPREHENSIVE METABOLIC PANEL
ALT: 20 U/L (ref 0–44)
AST: 20 U/L (ref 15–41)
Albumin: 3.7 g/dL (ref 3.5–5.0)
Alkaline Phosphatase: 62 U/L (ref 38–126)
Anion gap: 6 (ref 5–15)
BUN: 14 mg/dL (ref 8–23)
CO2: 28 mmol/L (ref 22–32)
Calcium: 8.8 mg/dL — ABNORMAL LOW (ref 8.9–10.3)
Chloride: 105 mmol/L (ref 98–111)
Creatinine, Ser: 0.78 mg/dL (ref 0.44–1.00)
GFR, Estimated: >60 mL/min (ref >60)
Glucose, Bld: 76 mg/dL (ref 70–99)
Potassium: 4.3 mmol/L (ref 3.5–5.1)
Sodium: 139 mmol/L (ref 135–145)
Total Bilirubin: 0.6 mg/dL (ref 0.3–1.2)
Total Protein: 7 g/dL (ref 6.5–8.1)

## 2023-04-14 LAB — HEMOGLOBIN A1C
Hgb A1c MFr Bld: 5.7 % — ABNORMAL HIGH (ref 4.8–5.6)
Mean Plasma Glucose: 116.89 mg/dL

## 2023-04-15 LAB — URINE CULTURE: Culture: NO GROWTH

## 2023-04-23 NOTE — Anesthesia Preprocedure Evaluation (Addendum)
Anesthesia Evaluation  Patient identified by MRN, date of birth, ID band Patient awake    Reviewed: Allergy & Precautions, NPO status , Patient's Chart, lab work & pertinent test results  Airway Mallampati: II  TM Distance: >3 FB Neck ROM: Full    Dental no notable dental hx. (+) Edentulous Upper, Missing, Dental Advisory Given,    Pulmonary sleep apnea    Pulmonary exam normal breath sounds clear to auscultation       Cardiovascular hypertension, Normal cardiovascular exam+ dysrhythmias Supra Ventricular Tachycardia  Rhythm:Regular Rate:Normal  04/14/2023 EKg NSR R 67   Neuro/Psych    GI/Hepatic   Endo/Other  diabetes, Type 2, Oral Hypoglycemic Agents    Renal/GU negative Renal ROSLab Results      Component                Value               Date                           K                        4.3                 04/14/2023                   CREATININE               0.78                04/14/2023                           Musculoskeletal  (+) Arthritis ,  Hx of Chronic pain   Abdominal  (+) + obese  Peds  Hematology   Anesthesia Other Findings   Reproductive/Obstetrics                             Anesthesia Physical Anesthesia Plan  ASA: 3  Anesthesia Plan: General   Post-op Pain Management: Ketamine IV* and Tylenol PO (pre-op)*   Induction: Intravenous  PONV Risk Score and Plan: 4 or greater and Treatment may vary due to age or medical condition, Midazolam, Ondansetron and Dexamethasone  Airway Management Planned: Oral ETT  Additional Equipment: None  Intra-op Plan:   Post-operative Plan: Extubation in OR  Informed Consent: I have reviewed the patients History and Physical, chart, labs and discussed the procedure including the risks, benefits and alternatives for the proposed anesthesia with the patient or authorized representative who has indicated his/her understanding  and acceptance.     Dental advisory given  Plan Discussed with:   Anesthesia Plan Comments:         Anesthesia Quick Evaluation

## 2023-04-24 ENCOUNTER — Other Ambulatory Visit (HOSPITAL_COMMUNITY): Payer: Self-pay

## 2023-04-24 ENCOUNTER — Observation Stay (HOSPITAL_COMMUNITY)
Admission: RE | Admit: 2023-04-24 | Discharge: 2023-04-25 | Disposition: A | Discharge status: Home / Self Care | Payer: Medicare HMO | Source: Ambulatory Visit | Attending: Urology | Admitting: Urology

## 2023-04-24 ENCOUNTER — Other Ambulatory Visit: Payer: Self-pay

## 2023-04-24 ENCOUNTER — Ambulatory Visit (HOSPITAL_COMMUNITY): Payer: Medicare HMO | Admitting: Anesthesiology

## 2023-04-24 ENCOUNTER — Encounter (HOSPITAL_COMMUNITY)
Admission: RE | Disposition: A | Discharge status: Home / Self Care | Payer: Self-pay | Source: Ambulatory Visit | Attending: Urology

## 2023-04-24 ENCOUNTER — Encounter (HOSPITAL_COMMUNITY): Payer: Self-pay | Admitting: Urology

## 2023-04-24 ENCOUNTER — Ambulatory Visit (HOSPITAL_BASED_OUTPATIENT_CLINIC_OR_DEPARTMENT_OTHER): Payer: Medicare HMO | Admitting: Anesthesiology

## 2023-04-24 DIAGNOSIS — I10 Essential (primary) hypertension: Secondary | ICD-10-CM

## 2023-04-24 DIAGNOSIS — E119 Type 2 diabetes mellitus without complications: Secondary | ICD-10-CM | POA: Diagnosis not present

## 2023-04-24 DIAGNOSIS — N8189 Other female genital prolapse: Secondary | ICD-10-CM

## 2023-04-24 DIAGNOSIS — Z7984 Long term (current) use of oral hypoglycemic drugs: Secondary | ICD-10-CM | POA: Insufficient documentation

## 2023-04-24 DIAGNOSIS — N819 Female genital prolapse, unspecified: Principal | ICD-10-CM | POA: Insufficient documentation

## 2023-04-24 DIAGNOSIS — R350 Frequency of micturition: Secondary | ICD-10-CM | POA: Insufficient documentation

## 2023-04-24 DIAGNOSIS — N814 Uterovaginal prolapse, unspecified: Secondary | ICD-10-CM | POA: Diagnosis not present

## 2023-04-24 DIAGNOSIS — Z79899 Other long term (current) drug therapy: Secondary | ICD-10-CM | POA: Insufficient documentation

## 2023-04-24 HISTORY — PX: ROBOTIC ASSISTED LAPAROSCOPIC HYSTERECTOMY AND SALPINGECTOMY: SHX6379

## 2023-04-24 HISTORY — PX: ROBOTIC ASSISTED LAPAROSCOPIC SACROCOLPOPEXY: SHX5388

## 2023-04-24 LAB — GLUCOSE, CAPILLARY
Glucose-Capillary: 52 mg/dL — ABNORMAL LOW (ref 70–99)
Glucose-Capillary: 84 mg/dL (ref 70–99)
Glucose-Capillary: 139 mg/dL — ABNORMAL HIGH (ref 70–99)
Glucose-Capillary: 183 mg/dL — ABNORMAL HIGH (ref 70–99)
Glucose-Capillary: 168 mg/dL — ABNORMAL HIGH (ref 70–99)
Glucose-Capillary: 99 mg/dL (ref 70–99)

## 2023-04-24 LAB — ABO/RH: ABO/RH(D): O POS

## 2023-04-24 LAB — TYPE AND SCREEN
ABO/RH(D): O POS
Antibody Screen: NEGATIVE

## 2023-04-24 LAB — HEMOGLOBIN AND HEMATOCRIT, BLOOD
HCT: 43 % (ref 36.0–46.0)
Hemoglobin: 13.8 g/dL (ref 12.0–15.0)

## 2023-04-24 SURGERY — SACROCOLPOPEXY, ROBOT-ASSISTED, LAPAROSCOPIC
Anesthesia: General

## 2023-04-24 MED ORDER — OXYCODONE HCL 5 MG PO TABS
ORAL_TABLET | ORAL | Status: AC
  Filled 2023-04-24: qty 1

## 2023-04-24 MED ORDER — PROPOFOL 10 MG/ML IV BOLUS
INTRAVENOUS | Status: DC | PRN
  Administered 2023-04-24: 150 mg via INTRAVENOUS

## 2023-04-24 MED ORDER — HYDROMORPHONE HCL 1 MG/ML IJ SOLN
0.2500 mg | INTRAMUSCULAR | Status: DC | PRN
  Administered 2023-04-24 (×4): 0.5 mg via INTRAVENOUS

## 2023-04-24 MED ORDER — POLYETHYLENE GLYCOL 3350 17 GM/SCOOP PO POWD: 1.0000 | Freq: Once | ORAL | Status: DC

## 2023-04-24 MED ORDER — OXYCODONE HCL 5 MG PO TABS
5.0000 mg | ORAL_TABLET | Freq: Once | ORAL | Status: AC | PRN
  Administered 2023-04-24: 5 mg via ORAL

## 2023-04-24 MED ORDER — SULFAMETHOXAZOLE-TRIMETHOPRIM 800-160 MG PO TABS
1.0000 | ORAL_TABLET | Freq: Two times a day (BID) | ORAL | 0 refills | Status: DC
  Filled 2023-04-24: qty 6, 3d supply, fill #0

## 2023-04-24 MED ORDER — ESTRADIOL 0.1 MG/GM VA CREA
1.0000 | TOPICAL_CREAM | VAGINAL | 1 refills | Status: AC
  Filled 2023-04-24: qty 42.5, 90d supply, fill #0

## 2023-04-24 MED ORDER — CEFAZOLIN SODIUM-DEXTROSE 2-4 GM/100ML-% IV SOLN
2.0000 g | INTRAVENOUS | Status: AC
  Administered 2023-04-24: 2 g via INTRAVENOUS
  Filled 2023-04-24: qty 100

## 2023-04-24 MED ORDER — SIMVASTATIN 20 MG PO TABS
20.0000 mg | ORAL_TABLET | Freq: Every day | ORAL | Status: DC
  Administered 2023-04-24 – 2023-04-25 (×2): 20 mg via ORAL
  Filled 2023-04-24 (×2): qty 1

## 2023-04-24 MED ORDER — ESTRADIOL 0.1 MG/GM VA CREA
TOPICAL_CREAM | VAGINAL | Status: DC | PRN
  Administered 2023-04-24: 1 via VAGINAL

## 2023-04-24 MED ORDER — SODIUM CHLORIDE (PF) 0.9 % IJ SOLN
INTRAMUSCULAR | Status: AC
  Filled 2023-04-24: qty 20

## 2023-04-24 MED ORDER — ACETAMINOPHEN 10 MG/ML IV SOLN
1000.0000 mg | Freq: Four times a day (QID) | INTRAVENOUS | Status: AC
  Administered 2023-04-24 – 2023-04-25 (×4): 1000 mg via INTRAVENOUS
  Filled 2023-04-24 (×4): qty 100

## 2023-04-24 MED ORDER — TRAMADOL HCL 50 MG PO TABS
50.0000 mg | ORAL_TABLET | Freq: Four times a day (QID) | ORAL | 0 refills | Status: DC | PRN
  Filled 2023-04-24: qty 20, 3d supply, fill #0

## 2023-04-24 MED ORDER — KETAMINE HCL 10 MG/ML IJ SOLN
INTRAMUSCULAR | Status: DC | PRN
  Administered 2023-04-24: 50 mg via INTRAVENOUS

## 2023-04-24 MED ORDER — LISINOPRIL 20 MG PO TABS
20.0000 mg | ORAL_TABLET | Freq: Every day | ORAL | Status: DC
  Administered 2023-04-24 – 2023-04-25 (×2): 20 mg via ORAL
  Filled 2023-04-24 (×2): qty 1

## 2023-04-24 MED ORDER — PROPOFOL 500 MG/50ML IV EMUL
INTRAVENOUS | Status: AC
  Filled 2023-04-24: qty 50

## 2023-04-24 MED ORDER — KETAMINE HCL 50 MG/5ML IJ SOSY
PREFILLED_SYRINGE | INTRAMUSCULAR | Status: AC
  Filled 2023-04-24: qty 5

## 2023-04-24 MED ORDER — BUPIVACAINE-EPINEPHRINE (PF) 0.5% -1:200000 IJ SOLN
INTRAMUSCULAR | Status: AC
  Filled 2023-04-24: qty 30

## 2023-04-24 MED ORDER — KETOROLAC TROMETHAMINE 30 MG/ML IJ SOLN
INTRAMUSCULAR | Status: AC
  Filled 2023-04-24: qty 1

## 2023-04-24 MED ORDER — DIPHENHYDRAMINE HCL 50 MG/ML IJ SOLN: 12.5000 mg | Freq: Four times a day (QID) | INTRAMUSCULAR | Status: DC | PRN

## 2023-04-24 MED ORDER — LACTATED RINGERS IV SOLN: INTRAVENOUS | Status: DC | PRN

## 2023-04-24 MED ORDER — MIDAZOLAM HCL 5 MG/5ML IJ SOLN
INTRAMUSCULAR | Status: DC | PRN
  Administered 2023-04-24: 2 mg via INTRAVENOUS

## 2023-04-24 MED ORDER — INSULIN ASPART 100 UNIT/ML IJ SOLN
0.0000 [IU] | INTRAMUSCULAR | Status: DC | PRN
Start: 2023-04-24 — End: 2023-04-24

## 2023-04-24 MED ORDER — KETOROLAC TROMETHAMINE 30 MG/ML IJ SOLN
30.0000 mg | Freq: Once | INTRAMUSCULAR | Status: AC | PRN
  Administered 2023-04-24: 30 mg via INTRAVENOUS

## 2023-04-24 MED ORDER — ROCURONIUM BROMIDE 100 MG/10ML IV SOLN
INTRAVENOUS | Status: DC | PRN
  Administered 2023-04-24: 10 mg via INTRAVENOUS
  Administered 2023-04-24: 70 mg via INTRAVENOUS
  Administered 2023-04-24: 30 mg via INTRAVENOUS
  Administered 2023-04-24: 20 mg via INTRAVENOUS

## 2023-04-24 MED ORDER — DOCUSATE SODIUM 100 MG PO CAPS: 100.0000 mg | ORAL_CAPSULE | Freq: Two times a day (BID) | ORAL | Status: DC

## 2023-04-24 MED ORDER — EPHEDRINE SULFATE (PRESSORS) 50 MG/ML IJ SOLN
INTRAMUSCULAR | Status: DC | PRN
  Administered 2023-04-24 (×2): 10 mg via INTRAVENOUS
  Administered 2023-04-24: 5 mg via INTRAVENOUS
  Administered 2023-04-24: 10 mg via INTRAVENOUS

## 2023-04-24 MED ORDER — CIPROFLOXACIN IN D5W 400 MG/200ML IV SOLN
400.0000 mg | INTRAVENOUS | Status: AC
  Administered 2023-04-24: 400 mg via INTRAVENOUS
  Filled 2023-04-24: qty 200

## 2023-04-24 MED ORDER — FENTANYL CITRATE (PF) 100 MCG/2ML IJ SOLN
INTRAMUSCULAR | Status: DC | PRN
  Administered 2023-04-24: 100 ug via INTRAVENOUS

## 2023-04-24 MED ORDER — HYDROMORPHONE HCL 1 MG/ML IJ SOLN
INTRAMUSCULAR | Status: AC
  Filled 2023-04-24: qty 1

## 2023-04-24 MED ORDER — MIDAZOLAM HCL 2 MG/2ML IJ SOLN
INTRAMUSCULAR | Status: AC
  Filled 2023-04-24: qty 2

## 2023-04-24 MED ORDER — OXYCODONE HCL 5 MG/5ML PO SOLN: 5.0000 mg | Freq: Once | ORAL | Status: AC | PRN

## 2023-04-24 MED ORDER — ACETAMINOPHEN 500 MG PO TABS
1000.0000 mg | ORAL_TABLET | Freq: Once | ORAL | Status: AC
  Administered 2023-04-24: 1000 mg via ORAL
  Filled 2023-04-24: qty 2

## 2023-04-24 MED ORDER — CHLORHEXIDINE GLUCONATE 0.12 % MT SOLN
15.0000 mL | Freq: Once | OROMUCOSAL | Status: AC
  Administered 2023-04-24: 15 mL via OROMUCOSAL

## 2023-04-24 MED ORDER — TRAMADOL HCL 50 MG PO TABS
50.0000 mg | ORAL_TABLET | Freq: Four times a day (QID) | ORAL | Status: DC | PRN
  Administered 2023-04-24 (×2): 50 mg via ORAL
  Filled 2023-04-24: qty 2
  Filled 2023-04-24: qty 1

## 2023-04-24 MED ORDER — ESTRADIOL 0.1 MG/GM VA CREA
TOPICAL_CREAM | VAGINAL | Status: AC
  Filled 2023-04-24: qty 42.5

## 2023-04-24 MED ORDER — BUPIVACAINE LIPOSOME 1.3 % IJ SUSP
INTRAMUSCULAR | Status: AC
  Filled 2023-04-24: qty 20

## 2023-04-24 MED ORDER — DEXTROSE 50 % IV SOLN: 25.0000 mL | Freq: Once | INTRAVENOUS | Status: AC

## 2023-04-24 MED ORDER — ONDANSETRON HCL 4 MG/2ML IJ SOLN
4.0000 mg | INTRAMUSCULAR | Status: DC | PRN
  Administered 2023-04-24: 4 mg via INTRAVENOUS
  Filled 2023-04-24: qty 2

## 2023-04-24 MED ORDER — DIPHENHYDRAMINE HCL 12.5 MG/5ML PO ELIX: 12.5000 mg | ORAL_SOLUTION | Freq: Four times a day (QID) | ORAL | Status: DC | PRN

## 2023-04-24 MED ORDER — HYOSCYAMINE SULFATE 0.125 MG SL SUBL: 0.1250 mg | SUBLINGUAL_TABLET | SUBLINGUAL | Status: DC | PRN

## 2023-04-24 MED ORDER — LIDOCAINE HCL (CARDIAC) PF 100 MG/5ML IV SOSY
PREFILLED_SYRINGE | INTRAVENOUS | Status: DC | PRN
  Administered 2023-04-24: 50 mg via INTRAVENOUS

## 2023-04-24 MED ORDER — DEXAMETHASONE SODIUM PHOSPHATE 4 MG/ML IJ SOLN
INTRAMUSCULAR | Status: DC | PRN
  Administered 2023-04-24: 8 mg via INTRAVENOUS

## 2023-04-24 MED ORDER — SODIUM CHLORIDE 0.45 % IV SOLN: INTRAVENOUS | Status: DC

## 2023-04-24 MED ORDER — SUGAMMADEX SODIUM 200 MG/2ML IV SOLN
INTRAVENOUS | Status: DC | PRN
  Administered 2023-04-24: 200 mg via INTRAVENOUS

## 2023-04-24 MED ORDER — FENTANYL CITRATE (PF) 100 MCG/2ML IJ SOLN
INTRAMUSCULAR | Status: AC
  Filled 2023-04-24: qty 2

## 2023-04-24 MED ORDER — HYDROMORPHONE HCL 1 MG/ML IJ SOLN
0.5000 mg | INTRAMUSCULAR | Status: DC | PRN
Start: 2023-04-24 — End: 2023-04-25
  Administered 2023-04-25: 1 mg via INTRAVENOUS
  Filled 2023-04-24: qty 1

## 2023-04-24 MED ORDER — KETOROLAC TROMETHAMINE 15 MG/ML IJ SOLN
15.0000 mg | Freq: Four times a day (QID) | INTRAMUSCULAR | Status: DC
  Administered 2023-04-24 – 2023-04-25 (×4): 15 mg via INTRAVENOUS
  Filled 2023-04-24 (×4): qty 1

## 2023-04-24 MED ORDER — ONDANSETRON HCL 4 MG/2ML IJ SOLN
INTRAMUSCULAR | Status: DC | PRN
  Administered 2023-04-24: 4 mg via INTRAVENOUS

## 2023-04-24 MED ORDER — DOCUSATE SODIUM 100 MG PO CAPS
100.0000 mg | ORAL_CAPSULE | Freq: Two times a day (BID) | ORAL | Status: DC
  Administered 2023-04-25: 100 mg via ORAL
  Filled 2023-04-24 (×2): qty 1

## 2023-04-24 MED ORDER — LIDOCAINE HCL (PF) 2 % IJ SOLN
INTRAMUSCULAR | Status: DC | PRN
  Administered 2023-04-24: 1.5 mg/kg/h via INTRADERMAL

## 2023-04-24 MED ORDER — ORAL CARE MOUTH RINSE: 15.0000 mL | Freq: Once | OROMUCOSAL | Status: AC

## 2023-04-24 MED ORDER — DEXTROSE 50 % IV SOLN
INTRAVENOUS | Status: AC
  Administered 2023-04-24: 25 mL via INTRAVENOUS
  Filled 2023-04-24: qty 50

## 2023-04-24 MED ORDER — DULOXETINE HCL 30 MG PO CPEP
30.0000 mg | ORAL_CAPSULE | Freq: Every day | ORAL | Status: DC
  Administered 2023-04-24: 30 mg via ORAL
  Filled 2023-04-24: qty 1

## 2023-04-24 MED ORDER — STERILE WATER FOR IRRIGATION IR SOLN
Status: DC | PRN
  Administered 2023-04-24: 1000 mL

## 2023-04-24 MED ORDER — INSULIN ASPART 100 UNIT/ML IJ SOLN
0.0000 [IU] | Freq: Three times a day (TID) | INTRAMUSCULAR | Status: DC
Start: 2023-04-24 — End: 2023-04-25
  Administered 2023-04-24: 3 [IU] via SUBCUTANEOUS
  Administered 2023-04-25: 2 [IU] via SUBCUTANEOUS

## 2023-04-24 MED ORDER — PROPOFOL 10 MG/ML IV BOLUS
INTRAVENOUS | Status: AC
  Filled 2023-04-24: qty 20

## 2023-04-24 MED ORDER — BUPIVACAINE-EPINEPHRINE 0.5% -1:200000 IJ SOLN
INTRAMUSCULAR | Status: DC | PRN
  Administered 2023-04-24: 10 mL

## 2023-04-24 MED ORDER — BUPIVACAINE-EPINEPHRINE (PF) 0.5% -1:200000 IJ SOLN
INTRAMUSCULAR | Status: DC | PRN
  Administered 2023-04-24: 40 mL

## 2023-04-24 MED ORDER — ONDANSETRON HCL 4 MG/2ML IJ SOLN: 4.0000 mg | Freq: Once | INTRAMUSCULAR | Status: DC | PRN

## 2023-04-24 MED ORDER — PREGABALIN 75 MG PO CAPS
150.0000 mg | ORAL_CAPSULE | Freq: Every day | ORAL | Status: DC
  Administered 2023-04-24 – 2023-04-25 (×2): 150 mg via ORAL
  Filled 2023-04-24 (×2): qty 2

## 2023-04-24 SURGICAL SUPPLY — 101 items
ADH SKN CLS APL DERMABOND .7 (GAUZE/BANDAGES/DRESSINGS) ×2
APL PRP STRL LF DISP 70% ISPRP (MISCELLANEOUS) ×2
BAG COUNTER SPONGE SURGICOUNT (BAG) IMPLANT
BAG DRN RND TRDRP ANRFLXCHMBR (UROLOGICAL SUPPLIES)
BAG SPNG CNTER NS LX DISP (BAG)
BAG URINE DRAIN 2000ML AR STRL (UROLOGICAL SUPPLIES) IMPLANT
BLADE HEX COATED 2.75 (ELECTRODE) ×3 IMPLANT
BLADE SURG 15 STRL LF DISP TIS (BLADE) ×6 IMPLANT
BLADE SURG 15 STRL SS (BLADE) ×4
BRIEF MESH DISP LRG (UNDERPADS AND DIAPERS) IMPLANT
CATH FOLEY 2WAY SLVR 5CC 16FR (CATHETERS) ×3 IMPLANT
CHLORAPREP W/TINT 26 (MISCELLANEOUS) ×3 IMPLANT
CLIP LIGATING HEM O LOK PURPLE (MISCELLANEOUS) ×3 IMPLANT
CLIP LIGATING HEMO LOK XL GOLD (MISCELLANEOUS) IMPLANT
CLIP LIGATING HEMO O LOK GREEN (MISCELLANEOUS) IMPLANT
CLIP SUT LAPRA TY ABSORB (SUTURE) IMPLANT
COVER BACK TABLE 60X90IN (DRAPES) ×3 IMPLANT
COVER MAYO STAND STRL (DRAPES) IMPLANT
COVER SURGICAL LIGHT HANDLE (MISCELLANEOUS) ×3 IMPLANT
COVER TIP SHEARS 8 DVNC (MISCELLANEOUS) ×3 IMPLANT
DERMABOND ADVANCED .7 DNX12 (GAUZE/BANDAGES/DRESSINGS) ×3 IMPLANT
DRAIN CHANNEL RND F F (WOUND CARE) IMPLANT
DRAPE ARM DVNC X/XI (DISPOSABLE) ×12 IMPLANT
DRAPE COLUMN DVNC XI (DISPOSABLE) ×3 IMPLANT
DRAPE INCISE IOBAN 66X45 STRL (DRAPES) ×3 IMPLANT
DRAPE SHEET LG 3/4 BI-LAMINATE (DRAPES) ×6 IMPLANT
DRAPE SURG IRRIG POUCH 19X23 (DRAPES) ×3 IMPLANT
DRIVER NDL LRG 8 DVNC XI (INSTRUMENTS) ×6 IMPLANT
DRIVER NDLE LRG 8 DVNC XI (INSTRUMENTS) ×4
ELECT PENCIL ROCKER SW 15FT (MISCELLANEOUS) ×3 IMPLANT
ELECT REM PT RETURN 15FT ADLT (MISCELLANEOUS) ×3 IMPLANT
FORCEPS BPLR LNG DVNC XI (INSTRUMENTS) ×3 IMPLANT
FORCEPS PROGRASP DVNC XI (FORCEP) ×3 IMPLANT
FORCEPS TENACULUM DVNC XI (FORCEP) IMPLANT
GAUZE 4X4 16PLY ~~LOC~~+RFID DBL (SPONGE) ×6 IMPLANT
GAUZE STRIP PACKING 2INX5YD (MISCELLANEOUS) ×3 IMPLANT
GLOVE BIO SURGEON STRL SZ 6.5 (GLOVE) ×3 IMPLANT
GLOVE BIOGEL PI IND STRL 8 (GLOVE) ×3 IMPLANT
GLOVE SURG LX STRL 7.5 STRW (GLOVE) ×9 IMPLANT
GOWN SRG XL LVL 4 BRTHBL STRL (GOWNS) IMPLANT
GOWN STRL NON-REIN XL LVL4 (GOWNS)
GOWN STRL REUS W/ TWL XL LVL3 (GOWN DISPOSABLE) ×6 IMPLANT
GOWN STRL REUS W/TWL XL LVL3 (GOWN DISPOSABLE) ×4
GOWN STRL SURGICAL XL XLNG (GOWN DISPOSABLE) ×3 IMPLANT
HOLDER FOLEY CATH W/STRAP (MISCELLANEOUS) ×3 IMPLANT
IRRIG SUCT STRYKERFLOW 2 WTIP (MISCELLANEOUS) ×2
IRRIGATION SUCT STRKRFLW 2 WTP (MISCELLANEOUS) ×3 IMPLANT
KIT BASIN OR (CUSTOM PROCEDURE TRAY) ×3 IMPLANT
KIT TURNOVER KIT A (KITS) IMPLANT
MANIPULATOR UTERINE 4.5 ZUMI (MISCELLANEOUS) IMPLANT
MARKER SKIN DUAL TIP RULER LAB (MISCELLANEOUS) ×3 IMPLANT
MESH Y UPSYLON VAGINAL (Mesh General) IMPLANT
NDL HYPO 22X1.5 SAFETY MO (MISCELLANEOUS) IMPLANT
NEEDLE HYPO 22X1.5 SAFETY MO (MISCELLANEOUS)
NS IRRIG 1000ML POUR BTL (IV SOLUTION) ×3 IMPLANT
OCCLUDER COLPOPNEUMO (BALLOONS) IMPLANT
PACK CYSTO (CUSTOM PROCEDURE TRAY) ×3 IMPLANT
PACKING VAGINAL (PACKING) IMPLANT
PAD OB MATERNITY 4.3X12.25 (PERSONAL CARE ITEMS) IMPLANT
PAD POSITIONING PINK XL (MISCELLANEOUS) ×3 IMPLANT
PLUG CATH AND CAP STRL 200 (CATHETERS) ×3 IMPLANT
RETRACTOR LONRSTAR 16.6X16.6CM (MISCELLANEOUS) ×3 IMPLANT
RETRACTOR STAY HOOK 5MM (MISCELLANEOUS) ×3 IMPLANT
RETRACTOR STER APS 16.6X16.6CM (MISCELLANEOUS) ×2
SCISSORS MNPLR CVD DVNC XI (INSTRUMENTS) ×3 IMPLANT
SEAL UNIV 5-12 XI (MISCELLANEOUS) ×12 IMPLANT
SET IRRIG Y TYPE TUR BLADDER L (SET/KITS/TRAYS/PACK) IMPLANT
SET TUBE SMOKE EVAC HIGH FLOW (TUBING) ×3 IMPLANT
SHEET LAVH (DRAPES) ×3 IMPLANT
SOL ELECTROSURG ANTI STICK (MISCELLANEOUS) ×2
SOL PREP POV-IOD 4OZ 10% (MISCELLANEOUS) IMPLANT
SOLUTION ELECTROSURG ANTI STCK (MISCELLANEOUS) ×3 IMPLANT
SPIKE FLUID TRANSFER (MISCELLANEOUS) ×3 IMPLANT
SURGILUBE 2OZ TUBE FLIPTOP (MISCELLANEOUS) IMPLANT
SUT MNCRL AB 4-0 PS2 18 (SUTURE) ×6 IMPLANT
SUT MON AB 3-0 SH 27 (SUTURE)
SUT MON AB 3-0 SH27 (SUTURE) IMPLANT
SUT PROLENE 2 0 CT 1 (SUTURE) ×3 IMPLANT
SUT STRATAFIX SPIRAL 3-0 PDS+ (SUTURE) ×2
SUT VIC AB 0 CT1 27 (SUTURE) ×2
SUT VIC AB 0 CT1 27XBRD ANTBC (SUTURE) ×3 IMPLANT
SUT VIC AB 2-0 SH 27 (SUTURE) ×10
SUT VIC AB 2-0 SH 27XBRD (SUTURE) ×15 IMPLANT
SUT VIC AB 2-0 UR6 27 (SUTURE) ×3 IMPLANT
SUT VIC AB 3-0 SH 27 (SUTURE) ×2
SUT VIC AB 3-0 SH 27X BRD (SUTURE) ×3 IMPLANT
SUT VICRYL 0 UR6 27IN ABS (SUTURE) ×3 IMPLANT
SUT VLOC 180 3-0 9IN GS21 (SUTURE) IMPLANT
SUTURE STRATFX SPIRAL 3-0 PDS+ (SUTURE) IMPLANT
SYR 10ML LL (SYRINGE) ×3 IMPLANT
SYR 50ML LL SCALE MARK (SYRINGE) IMPLANT
SYS BAG RETRIEVAL 10MM (BASKET) ×2
SYSTEM BAG RETRIEVAL 10MM (BASKET) IMPLANT
TOWEL OR 17X26 10 PK STRL BLUE (TOWEL DISPOSABLE) ×3 IMPLANT
TRAY LAPAROSCOPIC (CUSTOM PROCEDURE TRAY) ×3 IMPLANT
TROCAR XCEL 12X100 BLDLESS (ENDOMECHANICALS) IMPLANT
TROCAR Z THREAD OPTICAL 12X100 (TROCAR) ×3 IMPLANT
TROCAR Z-THREAD FIOS 5X100MM (TROCAR) ×3 IMPLANT
TUBING CONNECTING 10 (TUBING) ×3 IMPLANT
WATER STERILE IRR 1000ML POUR (IV SOLUTION) ×3 IMPLANT
WATER STERILE IRR 500ML POUR (IV SOLUTION) ×3 IMPLANT

## 2023-04-24 NOTE — Anesthesia Postprocedure Evaluation (Signed)
Anesthesia Post Note  Patient: Nurse, learning disability  Procedure(s) Performed: XI ROBOTIC ASSISTED LAPAROSCOPIC SACROCOLPOPEXY XI ROBOTIC ASSISTED LAPAROSCOPIC SUPRACERVICAL HYSTERECTOMY AND BILATERAL SALPINGOOPHORECTOMY (Bilateral)     Patient location during evaluation: PACU Anesthesia Type: General Level of consciousness: awake and alert Pain management: pain level controlled Vital Signs Assessment: post-procedure vital signs reviewed and stable Respiratory status: spontaneous breathing, nonlabored ventilation, respiratory function stable and patient connected to nasal cannula oxygen Cardiovascular status: blood pressure returned to baseline and stable Postop Assessment: no apparent nausea or vomiting Anesthetic complications: no   No notable events documented.  Last Vitals:  Vitals:   04/24/23 1330 04/24/23 1404  BP: 102/88 (!) 161/73  Pulse: 67 (!) 58  Resp: 15 16  Temp: 36.4 C (!) 36.3 C  SpO2: 99% 97%    Last Pain:  Vitals:   04/24/23 1444  TempSrc:   PainSc: 9                  Trevor Iha

## 2023-04-24 NOTE — Discharge Instructions (Addendum)
Activity:  You are encouraged to ambulate frequently (about every hour during waking hours) to help prevent blood clots from forming in your legs or lungs.  However, you should not engage in any heavy lifting (> 10-15 lbs), strenuous activity, or straining. Diet: You should advance your diet as instructed by your physician.  It will be normal to have some bloating, nausea, and abdominal discomfort intermittently. Prescriptions:  You will be provided a prescription for pain medication to take as needed.  If your pain is not severe enough to require the prescription pain medication, you may take extra strength Tylenol instead which will have less side effects.  You should also take a prescribed stool softener to avoid straining with bowel movements as the prescription pain medication may constipate you. Incisions: You may remove your dressing bandages 48 hours after surgery if not removed in the hospital.  You will either have some small staples or special tissue glue at each of the incision sites. Once the bandages are removed (if present), the incisions may stay open to air.  You may start showering (but not soaking or bathing in water) the 2nd day after surgery and the incisions simply need to be patted dry after the shower.  No additional care is needed. What to call us about: You should call the office [phone]) if you develop fever > 101 or develop persistent vomiting.   You may resume Mobic, aspirin, advil, aleve, vitamins, and supplements 7 days after surgery.    Stop Nitrofurantion while taking Bactrim.  Once Bactrim is completed you may resume Nitrofurantoin.

## 2023-04-24 NOTE — Op Note (Addendum)
Preoperative diagnosis:   Pelvic organ prolapse  Postoperative diagnosis:   Same  Procedure: Robotic-assisted laparoscopic supracervical hysterectomy and  bilateral salpingo-oopherectomy Robotic-assisted laparoscopic sacrocolpopexy  Surgeon: Crist Fat, MD First assistant: Harrie Foreman, PA-C Resident Assistant: Sharion Dove, MD  An assistant was required for this surgical procedure.  The duties of the assistant included but were not limited to suctioning, passing suture, camera manipulation, retraction. This procedure would not be able to be performed without an Geophysicist/field seismologist.   Anesthesia: General  Complications: None  Intraoperative findings:  [employer] Upsilon Y mesh used for the sacrocolpopexy.  EBL: 50 mL  Specimens: Uterus and proximal cervix, bilateral fallopian tubes and ovaries  Indication: Judy Downs is a 62 y.o. female patient with symptomatic pelvic organ prolapse.    After reviewing the management options for treatment, she elected to proceed with the above surgical procedure(s). We have discussed the potential benefits and risks of the procedure, side effects of the proposed treatment, the likelihood of the patient achieving the goals of the procedure, and any potential problems that might occur during the procedure or recuperation. Informed consent has been obtained.  Description of procedure:  The patient was taken to the operating room and general anesthesia was induced.  The patient was placed in the dorsal lithotomy position, prepped and draped in the usual sterile fashion, and preoperative antibiotics were administered. A preoperative time-out was performed.    A Foley catheter was then placed and placed to gravity drainage. I then made a periumbilical incision carrying the dissection down to the patient's fascia with electrocautery.  Once to the fascia, the fascia was incised and a small puncture hole made in the peritoneum to allow  passage of a 8mm port.   The abdomen was insufflated and the remaining ports placed under digital guidance.  2 ports were placed lateral to the umbilicus on the right proximally 10 cm apart.  The most lateral port was approximately 3 cm above the anterior iliac spine.  2 additional ports were placed in the patient's right side in comparable positions to the most lateral port on the right was a 12 mm port.the robot was then docked at an angle from the leg obliquely along the side of the left leg.  We then began our surgery by cleaning up some of the pelvic adhesions to the small bowel and colon.  Once this was completed I started dissecting at the sacral promontory located 3 cm medial to the location where the ureter crosses over the right iliac vessels at the pelvic brim. The posterior peritoneum was incised and the sacral prominence cleared off an area taking care to avoid the middle sacral vessels and the iliac branches.  I then created a posterior peritoneal tunnel starting at the sacral promontory and tunneling down the right pelvic sidewall down into the pelvis breaking back through the posterior peritoneum around the vesico-vaginal junction posteriorly.  I then continued the posterior dissection retracting down on the rectum and finding the avascular plane between the posterior vaginal wall and the rectum.  I carried this dissection down as far as I could to along the area of the perineal body.  Then focused my attention to the uterus and hysterectomy.  I first started by taking the right round ligament with a series of by polar cautery.  I then dissected the anterior leaf of the broad ligament slightly more proximal and then distally down across the anterior mucosa to the salpinx and the internal cervical os.  I then took of the uterine ovarian ligament on the right and dissected free the right salpinx. Once the anterior leaf of the broad ligament had been completely dissected on the patient's right and a  small bladder flap had been created anteriorly attention was turned to the left side where a similar dissection was carried out. I then turned my attention to the anterior plane between the anterior vaginal wall and the bladder.  I was able to obtain access to the avascular plane and with a combination of both monopolar cautery and blunt dissection was able to get down to the bladder neck.  I then turned my attention back to the patient's uterus and skeletonized the right uterine artery and vein and then took this with a series of bipolar moves.  I then performed a similar uterus pedicle ligation on the left.  At his point I was able to identify the patient's cervix and came through supracervical with monopolar cautery once the uterus was freed from all its attachments it was pushed into the left paracolic gutter and our attention was turned to placing the mesh.  Mesh was measured at approximately 8.5 cm anteriorly and 11 cm posteriorly and I cut this on the back table.  The mesh was then placed into the patient's abdomen through the assistant port and the anterior leaf was secured down onto the anterior vaginal wall with the apex at the bladder neck.  The posterior leaf was then secured down on the posterior vaginal wall.  These were sewn down with 2-0 Vicryl.  Between 6 and 8 were done on each side.  At this point I then went back to the previously dissected sacral promontory and posterior peritoneal tunnel and inserted a instrument through the tunnel and grasped the end of the mesh at the vaginal cuff and pull it up to the sacrum.  I then checked to ensure that the sacral mesh was not too tight by performing a vaginal exam.  I then secured the sacral leg of the mesh using a 0 Prolene.  I then reapproximated the posterior peritoneum with a 2-0 Vicryl in a running fashion around the sacral promontory.  The pelvic peritoneum was closed using a pursestring.  A small Endo Catch bag was then gently passed through the  assistant port and the uterus was placed in the bag.  The bag was then brought out through the camera port once the trochars were removed.  We then made a slightly larger extraction incision to remove the uterus.  The fascia was then closed with 0 Vicryl in a figure-of-eight fashion.  The skin was closed with 4-0 Monocryl's.  Dermabond was applied to the incision and exparel injected into the incisions.  Estrace impregnated packing was then placed into her vagina which will be left in overnight.  The patient was subsequently extubated and returned to the PACU in excellent condition.

## 2023-04-24 NOTE — Interval H&P Note (Signed)
History and Physical Interval Note:  04/24/2023 7:27 AM  Judy Downs  has presented today for surgery, with the diagnosis of PELVIC ORGAN PROLAPSE.  The various methods of treatment have been discussed with the patient and family. After consideration of risks, benefits and other options for treatment, the patient has consented to  Procedure(s) with comments: XI ROBOTIC ASSISTED LAPAROSCOPIC SACROCOLPOPEXY (N/A) - 270 MINUTES XI ROBOTIC ASSISTED LAPAROSCOPIC SUPRACERVICAL HYSTERECTOMY AND BILATERAL SALPINGOOPHORECTOMY (Bilateral) MID-URETHRAL SLING (N/A) CYSTOSCOPY (N/A) as a surgical intervention.  The patient's history has been reviewed, patient examined, no change in status, stable for surgery.  I have reviewed the patient's chart and labs.  Questions were answered to the patient's satisfaction.     Crist Fat

## 2023-04-24 NOTE — Plan of Care (Signed)
[facility] Tonsillectomy/Adenoidectomy, Postoperative PEDS care plan entered in error.

## 2023-04-24 NOTE — H&P (Signed)
Pelvic organ prolapse   62 year old female, referred by Dr. Pete Glatter, to discuss her pelvic organ prolapse. The patient noted a large bulge in her vagina about 10 days ago. She quickly saw Dr. Pete Glatter who then quickly referred her to see me. Fortunately we are able to get her in fairly quickly. The patient states that she has some burning and some associated pain. She has not tried pushing the prolapse back in. She does feel that she is voiding completely. She has urgency, which is a chronic problem. She has associated incontinence. She has been taking Gemtesa now for that which seems to be helping. She does not think that she has stress associated incontinence. She has chronic UTIs, she takes nitrofurantoin nightly for that. This seems to have controlled it fairly well.   The patient had a tubal ligation in the 1980s. She had 2 vaginal deliveries. She maintains her uterus.   The patient has a past surgical history of a cholecystectomy in 2001. She has had foot surgery twice, her tonsils removed and cataract surgery.   The patient has a history of type 2 diabetes, and takes metformin for that. She is morbidly obese, but is lost 90 pounds over the past year.   Urodynamics: The patient had fairly minimal PVR at the beginning of the study. She was noted to have a fairly normal bladder capacity with increased sensation. She had some mild overactive bladder symptoms or detrusor hyper reflexivity, but no urge associate incontinence. She had no leakage with Valsalva, but did have significant bladder descent.   Interval: Patient is here for further discussion regarding her upcoming surgery. She had urodynamics prior to her appointment today. She continues on [place] and has had fairly reasonable control with the [place]. Denies any hematuria dysuria. Denies any new symptoms or pain.     ALLERGIES: No Allergies    MEDICATIONS: Gemtesa 75 mg tablet  Metformin Hcl 500 mg tablet  Simvastatin 20 mg tablet   Cyclobenzaprine Hcl 10 mg tablet  Duloxetine Hcl 60 mg capsule,delayed release  Magnesium 500 mg capsule  Nitrofurantoin  Pregabalin 150 mg capsule     GU PSH: Complex cystometrogram, w/ void pressure and urethral pressure profile studies, any technique - 03/26/2023, 2021, 2019 Complex Uroflow - 03/26/2023, 2021, 2019 Emg surf Electrd - 03/26/2023, 2021, 2019 Inject For cystogram - 03/26/2023, 2021, 2019 Intrabd voidng Press - 03/26/2023, 2021, 2019     NON-GU PSH: Bilateral Tubal Ligation Knee Arthroscopy Tonsillectomy     GU PMH: Cystocele, Unspec - 03/26/2023, - 03/16/2023 Urge incontinence - 03/26/2023, - 03/16/2023 (Stable), - 2021, - 2019 Urinary Frequency - 03/26/2023, - 2021 Nocturia - 2021      PMH Notes: Supraventricular tachycardia   NON-GU PMH: Diabetes Type 2 GERD Hypercholesterolemia Hypertension Sleep Apnea    FAMILY HISTORY: No Family History    SOCIAL HISTORY: Marital Status: Married    REVIEW OF SYSTEMS:    GU Review Female:   Patient denies frequent urination, hard to postpone urination, burning /pain with urination, get up at night to urinate, leakage of urine, stream starts and stops, trouble starting your stream, have to strain to urinate, and being pregnant.  Gastrointestinal (Upper):   Patient denies nausea, vomiting, and indigestion/ heartburn.  Gastrointestinal (Lower):   Patient denies diarrhea and constipation.  Constitutional:   Patient denies fever, night sweats, weight loss, and fatigue.  Skin:   Patient denies skin rash/ lesion and itching.  Eyes:   Patient denies blurred vision and double vision.  Ears/ Nose/ Throat:   Patient denies sore throat and sinus problems.  Hematologic/Lymphatic:   Patient denies swollen glands and easy bruising.  Cardiovascular:   Patient denies leg swelling and chest pains.  Respiratory:   Patient denies cough and shortness of breath.  Endocrine:   Patient denies excessive thirst.  Musculoskeletal:   Patient  denies back pain and joint pain.  Neurological:   Patient denies headaches and dizziness.  Psychologic:   Patient denies depression and anxiety.   VITAL SIGNS: None   GU PHYSICAL EXAMINATION:    External Genitalia: No hirsutism, no rash, no scarring, no cyst, no erythematous lesion, no papular lesion, no blanched lesion, no warty lesion. No edema.  Urethral Meatus: Normal size. Normal position. No discharge.  Urethra: No tenderness, no mass, no scarring. No hypermobility. No leakage.  Bladder: Normal to palpation, no tenderness, no mass, normal size.   Vagina: Mild vaginal atrophy. Moderate rectocele. Large cystocele, midline. No stenosis. No enterocele.    Notes: The patient has procidentia with both cystocele and rectocele.   MULTI-SYSTEM PHYSICAL EXAMINATION:    Constitutional: Well-nourished. No physical deformities. Normally developed. Good grooming.  Respiratory: Normal breath sounds. No labored breathing, no use of accessory muscles.   Cardiovascular: Regular rate and rhythm. No murmur, no gallop. Normal temperature, normal extremity pulses, no swelling, no varicosities.      Complexity of Data:  Source Of History:  Patient  Records Review:   Previous Doctor Records, Previous Patient Records, POC Tool  Urine Test Review:   Urinalysis  Urodynamics Review:   Review Bladder Scan, Review Urodynamics Tests   PROCEDURES:          Pelvic Examination - [phone] Pelvic examination is performed in the presence of a chaperone. Hassie Bruce         Visit Complexity [phone]    ASSESSMENT:      ICD-10 Details  1 GU:   Cystocele, Unspec - N81.10   2   Urinary Frequency - R35.0    PLAN:           Document Letter(s):  Created for Patient: Clinical Summary         Notes:   I went over robotic-assisted laparoscopic sacral colpopexy with the patient detail. I explained to the patient the rationale for the surgery. I also went over the placement of the laparoscopic ports. I detailed to  her the surgery as well as the postoperative recovery time. I explained to the patient that she could expect to be in the hospital at least one or 2 nights. She will require 4 weeks of no heavy lifting, 6 weeks of no bending or twisting. She will not be able to use her vagina for 6 weeks. I discussed complications of the operation including injury to bowel, ureters, bladder. We also discussed the risk of failure as well as the complications of mesh. I explained to them the difference between transvaginal mesh and the mesh used for sacral colpopexy. I reassured them that there has not been an FDA warnings in regards to sacral colpopexy mesh. We will plan to get this prior to her surgery. I spent 45 minutes with the patient going over that ins and outs of the surgery and answering all her questions.   The patient does not need a mid urethral sling. She does need a hysterectomy. We discussed the procedure for cervical approach. Will plan to do a bilateral fallopian tube and ovary removal as well.

## 2023-04-24 NOTE — Transfer of Care (Signed)
Immediate Anesthesia Transfer of Care Note  Patient: Judy Downs  Procedure(s) Performed: XI ROBOTIC ASSISTED LAPAROSCOPIC SACROCOLPOPEXY XI ROBOTIC ASSISTED LAPAROSCOPIC SUPRACERVICAL HYSTERECTOMY AND BILATERAL SALPINGOOPHORECTOMY (Bilateral)  Patient Location: PACU  Anesthesia Type:General  Level of Consciousness: awake and alert   Airway & Oxygen Therapy: Patient Spontanous Breathing  Post-op Assessment: Report given to RN  Post vital signs: Reviewed and stable  Last Vitals:  Vitals Value Taken Time  BP 149/88 04/24/23 1146  Temp 97.1   Pulse 70 04/24/23 1151  Resp 11 04/24/23 1151  SpO2 97 % 04/24/23 1151  Vitals shown include unfiled device data.  Last Pain:  Vitals:   04/24/23 0639  TempSrc:   PainSc: 0-No pain         Complications: No notable events documented.

## 2023-04-24 NOTE — Anesthesia Procedure Notes (Signed)
Procedure Name: Intubation Date/Time: 04/24/2023 7:56 AM  Performed by: Carloyn Manner, CRNAPre-anesthesia Checklist: Patient identified, Emergency Drugs available, Suction available, Patient being monitored and Timeout performed Patient Re-evaluated:Patient Re-evaluated prior to induction Oxygen Delivery Method: Circle system utilized Preoxygenation: Pre-oxygenation with 100% oxygen Induction Type: IV induction Ventilation: Mask ventilation without difficulty Laryngoscope Size: 2 and Miller Grade View: Grade I Tube type: Oral Tube size: 7.0 mm Number of attempts: 1

## 2023-04-24 NOTE — Discharge Summary (Signed)
Date of admission: 04/24/2023  Date of discharge: 04/25/2023  Admission diagnosis:  Cystocele with prolapse [N81.4]   Discharge diagnosis:  Cystocele with prolapse [N81.4]  Secondary diagnoses:   Active Ambulatory Problems    Diagnosis Date Noted   Altered mental status 09/20/2019   Metabolic encephalopathy 09/21/2019   Acute lower UTI 09/22/2019   Urge incontinence 01/08/2023   Cystocele, midline 01/08/2023   History of UTI 01/08/2023   Resolved Ambulatory Problems    Diagnosis Date Noted   No Resolved Ambulatory Problems   Past Medical History:  Diagnosis Date   Anxiety    Arthritis    Carpal tunnel syndrome    Chronic pain    Depression    Diabetes mellitus without complication (HCC)    GERD (gastroesophageal reflux disease)    Hyperlipidemia    Hypertension    OSA (obstructive sleep apnea)    Palpitation    SVT (supraventricular tachycardia) (HCC)      History and Physical: For full details, please see admission history and physical. Briefly, Ellarie Picking is a 62 y.o. year old patient who was admitted with pelvic organ prolapse.   Hospital Course: Pt admitted and underwent sacral colpopexy and hysterectomy on 04/24/2023. Their hospital course was unremarkable. By POD1, they were tolerating a regular diet, voiding spontaneously, pain was controlled with oral medications, and they were deemed appropriate for discharge.   Their course was complicated by: None  On the day of discharge, the patient was tolerating a regular diet and their pain was well controlled. They were determined to be stable for discharge home. Vaginal packing was removed on POD1, will continue bactrim for 3 days.   Laboratory values:  Recent Labs    04/24/23 1216 04/25/23 0544  HGB 13.8 12.3  HCT 43.0 38.0   Recent Labs    04/25/23 0544  CREATININE 0.74    Disposition: Home  Discharge medications:  Allergies as of 04/25/2023   No Known Allergies      Medication List      STOP taking these medications    IBUPROFEN PM PO   meloxicam 7.5 MG tablet Commonly known as: MOBIC   nitrofurantoin 100 MG capsule Commonly known as: MACRODANTIN       TAKE these medications    diazepam 2 MG tablet Commonly known as: Valium Take one pill one hour prior to mri and then repeat just prior if needed   docusate sodium 100 MG capsule Commonly known as: COLACE Take 1 capsule (100 mg total) by mouth 2 (two) times daily.   DULoxetine 30 MG capsule Commonly known as: CYMBALTA Take 30 mg by mouth at bedtime.   estradiol 0.1 MG/GM vaginal cream Commonly known as: ESTRACE Place 1 Applicatorful vaginally 3 (three) times a week.   Gemtesa 75 MG Tabs Generic drug: Vibegron Take 1 tablet (75 mg total) by mouth daily.   lisinopril 20 MG tablet Commonly known as: ZESTRIL Take 1 tablet by mouth daily.   MELATONIN PO Take 1 tablet by mouth at bedtime.   metFORMIN 500 MG tablet Commonly known as: GLUCOPHAGE Take 500 mg by mouth 2 (two) times daily.   nystatin-triamcinolone cream Commonly known as: MYCOLOG II Apply 1 Application topically 3 (three) times daily as needed (yeast).   pregabalin 150 MG capsule Commonly known as: LYRICA Take 150 mg by mouth in the morning, at noon, and at bedtime.   simvastatin 20 MG tablet Commonly known as: ZOCOR Take 20 mg by mouth daily.  sulfamethoxazole-trimethoprim 800-160 MG tablet Commonly known as: BACTRIM DS Take 1 tablet by mouth 2 (two) times daily.   SYSTANE OP Place 1 drop into both eyes in the morning and at bedtime.   traMADol 50 MG tablet Commonly known as: Ultram Take 1-2 tablets (50-100 mg total) by mouth every 6 (six) hours as needed for moderate pain (pain score 4-6) or severe pain (pain score 7-10). What changed:  how much to take when to take this reasons to take this        Followup:   Follow-up Information     Vanessa Barbara, NP Follow up on 05/08/2023.   Why: at 2:15 Contact  information: [address] [place] [address] [phone]

## 2023-04-25 ENCOUNTER — Other Ambulatory Visit (HOSPITAL_COMMUNITY): Payer: Self-pay

## 2023-04-25 DIAGNOSIS — N819 Female genital prolapse, unspecified: Secondary | ICD-10-CM | POA: Diagnosis not present

## 2023-04-25 LAB — BASIC METABOLIC PANEL
Anion gap: 8 (ref 5–15)
BUN: 11 mg/dL (ref 8–23)
CO2: 25 mmol/L (ref 22–32)
Calcium: 8.6 mg/dL — ABNORMAL LOW (ref 8.9–10.3)
Chloride: 99 mmol/L (ref 98–111)
Creatinine, Ser: 0.74 mg/dL (ref 0.44–1.00)
GFR, Estimated: >60 mL/min (ref >60)
Glucose, Bld: 106 mg/dL — ABNORMAL HIGH (ref 70–99)
Potassium: 4.3 mmol/L (ref 3.5–5.1)
Sodium: 132 mmol/L — ABNORMAL LOW (ref 135–145)

## 2023-04-25 LAB — GLUCOSE, CAPILLARY
Glucose-Capillary: 71 mg/dL (ref 70–99)
Glucose-Capillary: 125 mg/dL — ABNORMAL HIGH (ref 70–99)

## 2023-04-25 LAB — HEMOGLOBIN AND HEMATOCRIT, BLOOD
HCT: 38 % (ref 36.0–46.0)
Hemoglobin: 12.3 g/dL (ref 12.0–15.0)

## 2023-04-25 NOTE — TOC Initial Note (Signed)
Transition of Care [facility]) - Initial/Assessment Note    Patient Details  Name: Judy Downs MRN: [id number] Date of Birth: Feb 03, 1961  [facility] CM/SW Contact:    Judy Prows, RN Phone Number: 04/25/2023, 1:35 PM  Clinical Narrative:                 [name] w/ pt in room; pt says she is from home and plans to return at d/c; pt says she has transportation; she identified POC Judy Downs (spouse) [phone]; pt says she has a difficult time paying for food; she denies other SDOH risks; she has reading glasses, dentures (upper/lower), cane, walker, shower chair, and grab bars in shower; pt says she does not have HH services or home oxygen; she agrees to receive resource for social services; guide to social services placed in d/c instructions; copy also given to pt; she will make appt w/ agency; no TOC needs.  Expected Discharge Plan: Home/Self Care Barriers to Discharge: No Barriers Identified   Patient Goals and CMS Choice Patient states their goals for this hospitalization and ongoing recovery are:: home CMS Medicare.gov Compare Post Acute Care list provided to:: Patient        Expected Discharge Plan and Services   Discharge Planning Services: CM Consult Post Acute Care Choice: NA Living arrangements for the past 2 months: Single Family Home Expected Discharge Date: 04/25/23               DME Arranged: N/A DME Agency: NA       HH Arranged: NA HH Agency: NA        Prior Living Arrangements/Services Living arrangements for the past 2 months: Single Family Home Lives with:: Spouse Patient language and need for interpreter reviewed:: Yes Do you feel safe going back to the place where you live?: Yes      Need for Family Participation in Patient Care: Yes (Comment) Care giver support system in place?: Yes (comment) Current home services: DME (cane, walker) Criminal Activity/Legal Involvement Pertinent to Current  Situation/Hospitalization: No - Comment as needed  Activities of Daily Living   ADL Screening (condition at time of admission) Independently performs ADLs?: Yes (appropriate for developmental age) Is the patient deaf or have difficulty hearing?: No Does the patient have difficulty seeing, even when wearing glasses/contacts?: No Does the patient have difficulty concentrating, remembering, or making decisions?: No  Permission Sought/Granted Permission sought to share information with : Case Manager Permission granted to share information with : Yes, Verbal Permission Granted  Share Information with NAME: Case Manager     Permission granted to share info w Relationship: [name] (spouse) [phone]     Emotional Assessment Appearance:: Appears stated age Attitude/Demeanor/Rapport: Gracious Affect (typically observed): Accepting Orientation: : Oriented to Self, Oriented to Place, Oriented to  Time, Oriented to Situation Alcohol / Substance Use: Not Applicable Psych Involvement: No (comment)  Admission diagnosis:  Cystocele with prolapse [N81.4] Patient Active Problem List   Diagnosis Date Noted   Cystocele with prolapse 04/24/2023   Urge incontinence 01/08/2023   Cystocele, midline 01/08/2023   History of UTI 01/08/2023   Acute lower UTI 09/22/2019   Metabolic encephalopathy 09/21/2019   Altered mental status 09/20/2019   PCP:  Eather Colas, FNP Pharmacy:   [facility] [address], [place] - [address] [facility] [place] [address] Phone: [phone] Fax: [phone]     Social Determinants of Health (SDOH) Social History: SDOH Screenings  Food Insecurity: Food Insecurity Present (04/25/2023)  Housing: Low Risk  (04/25/2023)  Transportation Needs: No Transportation Needs (04/25/2023)  Utilities: Not At Risk (04/25/2023)  Tobacco Use: Low Risk  (04/24/2023)   SDOH Interventions: Food Insecurity Interventions: Inpatient TOC, Other  (Comment) (resource given) Housing Interventions: Intervention Not Indicated, Inpatient TOC Transportation Interventions: Intervention Not Indicated, Inpatient TOC Utilities Interventions: Intervention Not Indicated, Inpatient TOC   Readmission Risk Interventions     No data to display

## 2023-04-25 NOTE — Progress Notes (Signed)
Patient was assisted to the bathroom after stating the she felt like she needed to have a bowel movement. As soon as patient sat on the toilet vaginal packing came out. There was a small amount of bloody drainage noted on the packing. No additional bleeding noted after packing came out. Patient did not do any straining while on the commode. No BM produced, only gas. Patient denied an increase in pain/discomfort after packing came out.

## 2023-04-25 NOTE — Care Management Obs Status (Signed)
MEDICARE OBSERVATION STATUS NOTIFICATION   Patient Details  Name: Judy Downs MRN: [id number] Date of Birth: 1961/04/11   Medicare Observation Status Notification Given:  Yes    Adrian Prows, RN 04/25/2023, 1:24 PM

## 2023-04-25 NOTE — Plan of Care (Signed)
  Problem: Skin Integrity: Goal: Risk for impaired skin integrity will decrease Outcome: Progressing   Problem: Coping: Goal: Ability to adjust to condition or change in health will improve Outcome: Progressing   Problem: Metabolic: Goal: Ability to maintain appropriate glucose levels will improve Outcome: Progressing   Problem: Education: Goal: Knowledge of General Education information will improve Description: Including pain rating scale, medication(s)/side effects and non-pharmacologic comfort measures Outcome: Progressing   Problem: Clinical Measurements: Goal: Ability to maintain clinical measurements within normal limits will improve Outcome: Progressing   Problem: Activity: Goal: Risk for activity intolerance will decrease Outcome: Progressing   Problem: Pain Management: Goal: General experience of comfort will improve Outcome: Progressing

## 2023-04-27 ENCOUNTER — Other Ambulatory Visit (HOSPITAL_COMMUNITY): Payer: Self-pay

## 2023-04-27 ENCOUNTER — Encounter (HOSPITAL_COMMUNITY): Payer: Self-pay | Admitting: Urology

## 2023-04-27 LAB — SURGICAL PATHOLOGY

## 2023-05-06 ENCOUNTER — Ambulatory Visit: Payer: Medicare HMO | Admitting: Urology

## 2023-05-28 ENCOUNTER — Other Ambulatory Visit (HOSPITAL_COMMUNITY): Payer: Self-pay

## 2023-07-20 DIAGNOSIS — Z01818 Encounter for other preprocedural examination: Secondary | ICD-10-CM | POA: Diagnosis not present

## 2024-05-24 ENCOUNTER — Telehealth: Payer: Self-pay

## 2024-05-24 ENCOUNTER — Other Ambulatory Visit

## 2024-05-24 ENCOUNTER — Other Ambulatory Visit: Payer: Self-pay

## 2024-05-24 DIAGNOSIS — N3941 Urge incontinence: Secondary | ICD-10-CM

## 2024-05-24 LAB — URINALYSIS, ROUTINE W REFLEX MICROSCOPIC
Bilirubin, UA: NEGATIVE
Glucose, UA: NEGATIVE
Leukocytes,UA: NEGATIVE
Nitrite, UA: NEGATIVE
Protein,UA: NEGATIVE
RBC, UA: NEGATIVE
Specific Gravity, UA: 1.025 (ref 1.005–1.030)
Urobilinogen, Ur: 0.2 mg/dL (ref 0.2–1.0)
pH, UA: 6.5 (ref 5.0–7.5)

## 2024-05-24 LAB — MICROSCOPIC EXAMINATION

## 2024-05-24 NOTE — Telephone Encounter (Signed)
 Pt called in stating she thinks she has a UTI. Pt c/o incontinence, dysuria, lower abd cramp. Pt will drop a urine off today and then see Dr. Roseann Thursday.

## 2024-05-26 ENCOUNTER — Encounter: Payer: Self-pay | Admitting: Urology

## 2024-05-26 ENCOUNTER — Ambulatory Visit: Admitting: Urology

## 2024-05-26 VITALS — BP 138/76 | HR 83 | Ht 63.0 in | Wt 213.0 lb

## 2024-05-26 DIAGNOSIS — N3941 Urge incontinence: Secondary | ICD-10-CM

## 2024-05-26 DIAGNOSIS — Z87440 Personal history of urinary (tract) infections: Secondary | ICD-10-CM | POA: Diagnosis not present

## 2024-05-26 DIAGNOSIS — N819 Female genital prolapse, unspecified: Secondary | ICD-10-CM

## 2024-05-26 LAB — BLADDER SCAN AMB NON-IMAGING

## 2024-05-26 MED ORDER — GEMTESA 75 MG PO TABS: 75.0000 mg | ORAL_TABLET | Freq: Every day | ORAL | 11 refills | Status: AC

## 2024-05-26 NOTE — Progress Notes (Signed)
 Assessment: 1. Urge incontinence   2. History of UTI   3. Female genital prolapse, unspecified type     Plan: No evidence of UTI on urinalysis from 05/24/2024. Resume Gemtesa  75 mg daily.  Rx sent.  Return to office in 6 weeks.  Chief Complaint:  Chief Complaint  Patient presents with   Urinary Incontinence    History of Present Illness:  Judy Downs is a 63 y.o. female who is seen for evaluation of urge incontinence, history of UTIs, and cystocele with rectocele. She was previously followed at [facility] health urology in [facility].    Urologic History: She initially presented with a 3 month history of frequency, urgency, and urge incontinence. No SUI. No pad use at that time. She previously tried Toviaz without benefit. She was given a trial of Myrbetriq at her visit in March 2018 and noted significant improvement in her urinary symptoms with decreased frequency and urgency. She was not having any urge incontinence. No side effects from medication. She was changed to oxybutynin due to insurance coverage. She initially had good results with medication. She again noted worsening of her incontinence with urgency with standing and associated leakage. She was taking the oxybutynin 3 times daily without benefit. She was changed to trospium 20 mg BID in May 2019. She stopped the medication due to ineffectiveness. She continued with urge incontinence.  She was treated for UTI in 4/18. Urine culture grew Escherichia coli. She was treated with Macrobid  10 days. She has been treated for UTIs in 10/18, 3/19, and 4/19. Urine cultures as follows:  10/18 coag negative staph  3/19 Proteus  4/19 E. coli  10/19 E. coli.  She was treated with Cipro  x3 days and started on daily Macrobid  for UTI prevention.  She discontinued the Macrobid .  Pelvic examination revealed a grade 2-3 cystocele and a rectocele.  Urodynamics from 04/13/2018 showed a normal bladder capacity, increased sensation  of filling, no stress incontinence with or without reduction of her prolapse, low amplitude instability without leakage. She was able to void voluntarily with a bladder residual of 88 mL.  She was treated with posterior tibial nerve stimulation, completing 12 of 12 treatments in 2/20. She noted significant improvement with the treatments and was no longer having urge incontinence. She did not have urinary frequency or urgency. She was no longer taking Vesicare.  She presented in 10/20 with increased urinary symptoms of frequency, urgency, and urge incontinence. She was no longer taking Macrobid . Urine culture from 04/19/2019 grew >100 K E. coli. She was treated with Septra  DS x7 days and restarted on daily Macrobid . She was admitted to [facility] in November 2020 with altered mental status. Her urinalysis on admission showed TNTC RBCs. Urine culture showed no significant growth. Blood cultures showed no growth as well. CT imaging showed no obvious abnormalities involving the kidneys, ureters, or bladder.  She was continued on daily Macrobid  and Vesicare.  She was doing well at the time of her visit in February 2021.  At her visit in June 2021, she reported worsening of her incontinence symptoms. She was having frequent incontinence episodes with and without urgency. She had leakage with standing to go to bathroom. She reported some intermittent episodes of frequency, nocturia x 1, and urgency. She continued on Vesicare. She was not on daily Macrobid  stating this was discontinued during one of her hospitalizations. She was given a trial of Gemtesa  75 mg daily in June 2021. She did not see any improvement  in her incontinence. She continued to have urgency and urge incontinence.  Urine culture from 01/12/2020 grew >100 K Klebsiella. She was treated with Bactrim  DS and restarted on daily Macrobid .  Urodynamics from 02/14/2020 showed a normal bladder capacity with increased sensation of filling, no  evidence of stress incontinence with or without reduction of prolapse, low amplitude instability with associated urgency but without obvious leakage.   At her visit in 10/21, she was not taking any medications for her incontinence. She continued on daily Macrobid  at that time. She was noting some occasional discomfort in the urethra after voiding. She continued to have urgency and urge incontinence. She was not having any stress incontinence. Treatment options for her urge incontinence discussed including botulinum toxin therapy.  At her visit in April 2022, she continued to have urge incontinence, nocturia 3-4 times and urgency. She had not been on her daily Macrobid  for at least 2 months. No recent UTI symptoms. She was not taking any medication for her incontinence.  She was seen by Dr. Chauncey in November 2022.  Additional treatment with Botox was discussed.  At her visit to establish care in July 2024, she continued to have symptoms of frequency, urgency, nocturia x 3, and urge incontinence.  She was not using any pads.  She was not on any medication for her incontinence.  She continued on daily Macrodantin  for UTI prevention.  No UTI symptoms.  No dysuria or gross hematuria. U/A was negative.  PVR = 0 ml. She was given a trial of Gemtesa  75 mg daily. At her visit in 8/24, she had noted significant improvement in her frequency, urgency, nocturia, and urge incontinence with the Gemtesa .  No side effects from the medication.  She was very pleased with the salts of the medication.  She continued on daily Macrodantin  for UTI prevention.  No dysuria or gross hematuria.  At her visit in September 2024, she had recently noted a increased bulge in the vaginal area.  She was having some discomfort associated with this.  Her urinary symptoms remained stable on Gemtesa .  She continued with daily Macrobid  and was not having any UTI symptoms. She was referred to Dr. Cam for management of her prolapse. She  underwent a robotic assisted laparoscopic hysterectomy and sacrocolpopexy on 04/24/2023. It does not appear that she return for any follow-up after her procedure.  She called the office 2 days ago with increased urinary symptoms and concern for possible UTI. Urinalysis from 05/24/2024 showed 0-5 WBCs, 0-2 RBCs. She is having increased urgency and urge incontinence for approximately 1 week.  She also reports some dysuria and low back pain.  No gross hematuria. She has been off the Gemtesa  for quite some time.  No recent UTIs.  Portions of the above documentation were copied from a prior visit for review purposes only.    Past Medical History:  Past Medical History:  Diagnosis Date   Anxiety    Arthritis    Carpal tunnel syndrome    Chronic pain    Depression    Diabetes mellitus without complication (HCC)    GERD (gastroesophageal reflux disease)    Hyperlipidemia    Hypertension    OSA (obstructive sleep apnea)    Palpitation    SVT (supraventricular tachycardia)     Past Surgical History:  Past Surgical History:  Procedure Laterality Date   CATARACT EXTRACTION Bilateral    FOOT SURGERY Bilateral    KNEE ARTHROSCOPY     ROBOTIC ASSISTED LAPAROSCOPIC HYSTERECTOMY  AND SALPINGECTOMY Bilateral 04/24/2023   Procedure: XI ROBOTIC ASSISTED LAPAROSCOPIC SUPRACERVICAL HYSTERECTOMY AND BILATERAL SALPINGOOPHORECTOMY;  Surgeon: Cam Morene ORN, MD;  Location: [facility];  Service: Urology;  Laterality: Bilateral;   ROBOTIC ASSISTED LAPAROSCOPIC SACROCOLPOPEXY N/A 04/24/2023   Procedure: XI ROBOTIC ASSISTED LAPAROSCOPIC SACROCOLPOPEXY;  Surgeon: Cam Morene ORN, MD;  Location: [facility];  Service: Urology;  Laterality: N/A;  270 MINUTES   TONSILLECTOMY     TUBAL LIGATION      Allergies:  No Known Allergies  Family History:  No family history on file.  Social History:  Social History   Tobacco Use   Smoking status: Never   Smokeless tobacco: Never  Substance Use Topics    Alcohol use: Yes    Comment: occasional   Drug use: Never    ROS: Constitutional:  Negative for fever, chills, weight loss CV: Negative for chest pain, previous MI, hypertension Respiratory:  Negative for shortness of breath, wheezing, sleep apnea, frequent cough GI:  Negative for nausea, vomiting, bloody stool, GERD  Physical exam: BP 138/76   Pulse 83   Ht 5' 3 (1.6 m)   Wt 213 lb (96.6 kg)   BMI 37.73 kg/m  GENERAL APPEARANCE:  Well appearing, well developed, well nourished, NAD HEENT:  Atraumatic, normocephalic, oropharynx clear NECK:  Supple without lymphadenopathy or thyromegaly ABDOMEN:  Soft, non-tender, no masses EXTREMITIES:  Moves all extremities well, without clubbing, cyanosis, or edema NEUROLOGIC:  Alert and oriented x 3, normal gait, CN II-XII grossly intact MENTAL STATUS:  appropriate BACK:  Non-tender to palpation, No CVAT SKIN:  Warm, dry, and intact  Results: Results for orders placed or performed in visit on 05/24/24 (from the past 72 hours)  Urinalysis, Routine w reflex microscopic     Status: Abnormal   Collection Time: 05/24/24 12:00 AM  Result Value Ref Range   Specific Gravity, UA 1.025 1.005 - 1.030   pH, UA 6.5 5.0 - 7.5   Color, UA Yellow Yellow   Appearance Ur Clear Clear   Leukocytes,UA Negative Negative   Protein,UA Negative Negative/Trace   Glucose, UA Negative Negative   Ketones, UA Trace (A) Negative   RBC, UA Negative Negative   Bilirubin, UA Negative Negative   Urobilinogen, Ur 0.2 0.2 - 1.0 mg/dL   Nitrite, UA Negative Negative   Microscopic Examination See below:   Microscopic Examination     Status: None   Collection Time: 05/24/24 12:00 AM   Urine  Result Value Ref Range   WBC, UA 0-5 0 - 5 /hpf   RBC, Urine 0-2 0 - 2 /hpf   Epithelial Cells (non renal) 0-10 0 - 10 /hpf   Bacteria, UA Few None seen/Few     PVR = 18 ml

## 2024-07-14 ENCOUNTER — Ambulatory Visit: Admitting: Urology

## 2024-07-14 NOTE — Progress Notes (Unsigned)
 "  Assessment: 1. Urge incontinence   2. History of UTI   3. Female genital prolapse, unspecified type     Plan: Resume Gemtesa  75 mg daily.  Rx sent.  Return to office in 6 weeks.  Chief Complaint:  No chief complaint on file.   History of Present Illness:  Judy Downs is a 64 y.o. female who is seen for evaluation of urge incontinence, history of UTIs, and cystocele with rectocele. She was previously followed at [facility] health urology in [facility].    Urologic History: She initially presented with a 3 month history of frequency, urgency, and urge incontinence. No SUI. No pad use at that time. She previously tried Toviaz without benefit. She was given a trial of Myrbetriq at her visit in March 2018 and noted significant improvement in her urinary symptoms with decreased frequency and urgency. She was not having any urge incontinence. No side effects from medication. She was changed to oxybutynin due to insurance coverage. She initially had good results with medication. She again noted worsening of her incontinence with urgency with standing and associated leakage. She was taking the oxybutynin 3 times daily without benefit. She was changed to trospium 20 mg BID in May 2019. She stopped the medication due to ineffectiveness. She continued with urge incontinence.  She was treated for UTI in 4/18. Urine culture grew Escherichia coli. She was treated with Macrobid  10 days. She has been treated for UTIs in 10/18, 3/19, and 4/19. Urine cultures as follows:  10/18 coag negative staph  3/19 Proteus  4/19 E. coli  10/19 E. coli.  She was treated with Cipro  x3 days and started on daily Macrobid  for UTI prevention.  She discontinued the Macrobid .  Pelvic examination revealed a grade 2-3 cystocele and a rectocele.  Urodynamics from 04/13/2018 showed a normal bladder capacity, increased sensation of filling, no stress incontinence with or without reduction of her prolapse, low  amplitude instability without leakage. She was able to void voluntarily with a bladder residual of 88 mL.  She was treated with posterior tibial nerve stimulation, completing 12 of 12 treatments in 2/20. She noted significant improvement with the treatments and was no longer having urge incontinence. She did not have urinary frequency or urgency. She was no longer taking Vesicare.  She presented in 10/20 with increased urinary symptoms of frequency, urgency, and urge incontinence. She was no longer taking Macrobid . Urine culture from 04/19/2019 grew >100 K E. coli. She was treated with Septra  DS x7 days and restarted on daily Macrobid . She was admitted to [facility] in November 2020 with altered mental status. Her urinalysis on admission showed TNTC RBCs. Urine culture showed no significant growth. Blood cultures showed no growth as well. CT imaging showed no obvious abnormalities involving the kidneys, ureters, or bladder.  She was continued on daily Macrobid  and Vesicare.  She was doing well at the time of her visit in February 2021.  At her visit in June 2021, she reported worsening of her incontinence symptoms. She was having frequent incontinence episodes with and without urgency. She had leakage with standing to go to bathroom. She reported some intermittent episodes of frequency, nocturia x 1, and urgency. She continued on Vesicare. She was not on daily Macrobid  stating this was discontinued during one of her hospitalizations. She was given a trial of Gemtesa  75 mg daily in June 2021. She did not see any improvement in her incontinence. She continued to have urgency and urge incontinence.  Urine  culture from 01/12/2020 grew >100 K Klebsiella. She was treated with Bactrim  DS and restarted on daily Macrobid .  Urodynamics from 02/14/2020 showed a normal bladder capacity with increased sensation of filling, no evidence of stress incontinence with or without reduction of prolapse, low amplitude  instability with associated urgency but without obvious leakage.   At her visit in 10/21, she was not taking any medications for her incontinence. She continued on daily Macrobid  at that time. She was noting some occasional discomfort in the urethra after voiding. She continued to have urgency and urge incontinence. She was not having any stress incontinence. Treatment options for her urge incontinence discussed including botulinum toxin therapy.  At her visit in April 2022, she continued to have urge incontinence, nocturia 3-4 times and urgency. She had not been on her daily Macrobid  for at least 2 months. No recent UTI symptoms. She was not taking any medication for her incontinence.  She was seen by Dr. Chauncey in November 2022.  Additional treatment with Botox was discussed.  At her visit to establish care in July 2024, she continued to have symptoms of frequency, urgency, nocturia x 3, and urge incontinence.  She was not using any pads.  She was not on any medication for her incontinence.  She continued on daily Macrodantin  for UTI prevention.  No UTI symptoms.  No dysuria or gross hematuria. U/A was negative.  PVR = 0 ml. She was given a trial of Gemtesa  75 mg daily. At her visit in 8/24, she had noted significant improvement in her frequency, urgency, nocturia, and urge incontinence with the Gemtesa .  No side effects from the medication.  She was very pleased with the salts of the medication.  She continued on daily Macrodantin  for UTI prevention.  No dysuria or gross hematuria.  At her visit in September 2024, she had recently noted a increased bulge in the vaginal area.  She was having some discomfort associated with this.  Her urinary symptoms remained stable on Gemtesa .  She continued with daily Macrobid  and was not having any UTI symptoms. She was referred to Dr. Cam for management of her prolapse. She underwent a robotic assisted laparoscopic hysterectomy and sacrocolpopexy on  04/24/2023. It does not appear that she return for any follow-up after her procedure.  She was seen in December 2025 with increased urinary symptoms and concern for possible UTI. Urinalysis from 05/24/2024 showed 0-5 WBCs, 0-2 RBCs. She reported increased urgency and urge incontinence for approximately 1 week.  She also reported some dysuria and low back pain.  No gross hematuria. She has been off the Gemtesa  for quite some time.  No recently diagnosed UTIs. She was restarted on Gemtesa  75 mg daily.  Portions of the above documentation were copied from a prior visit for review purposes only.    Past Medical History:  Past Medical History:  Diagnosis Date   Anxiety    Arthritis    Carpal tunnel syndrome    Chronic pain    Depression    Diabetes mellitus without complication (HCC)    GERD (gastroesophageal reflux disease)    Hyperlipidemia    Hypertension    OSA (obstructive sleep apnea)    Palpitation    SVT (supraventricular tachycardia)     Past Surgical History:  Past Surgical History:  Procedure Laterality Date   CATARACT EXTRACTION Bilateral    FOOT SURGERY Bilateral    KNEE ARTHROSCOPY     ROBOTIC ASSISTED LAPAROSCOPIC HYSTERECTOMY AND SALPINGECTOMY Bilateral 04/24/2023  Procedure: XI ROBOTIC ASSISTED LAPAROSCOPIC SUPRACERVICAL HYSTERECTOMY AND BILATERAL SALPINGOOPHORECTOMY;  Surgeon: Cam Morene ORN, MD;  Location: [facility];  Service: Urology;  Laterality: Bilateral;   ROBOTIC ASSISTED LAPAROSCOPIC SACROCOLPOPEXY N/A 04/24/2023   Procedure: XI ROBOTIC ASSISTED LAPAROSCOPIC SACROCOLPOPEXY;  Surgeon: Cam Morene ORN, MD;  Location: [facility];  Service: Urology;  Laterality: N/A;  270 MINUTES   TONSILLECTOMY     TUBAL LIGATION      Allergies:  No Known Allergies  Family History:  No family history on file.  Social History:  Social History   Tobacco Use   Smoking status: Never   Smokeless tobacco: Never  Substance Use Topics   Alcohol use: Yes    Comment:  occasional   Drug use: Never    ROS: Constitutional:  Negative for fever, chills, weight loss CV: Negative for chest pain, previous MI, hypertension Respiratory:  Negative for shortness of breath, wheezing, sleep apnea, frequent cough GI:  Negative for nausea, vomiting, bloody stool, GERD  Physical exam: There were no vitals taken for this visit. GENERAL APPEARANCE:  Well appearing, well developed, well nourished, NAD HEENT:  Atraumatic, normocephalic, oropharynx clear NECK:  Supple without lymphadenopathy or thyromegaly ABDOMEN:  Soft, non-tender, no masses EXTREMITIES:  Moves all extremities well, without clubbing, cyanosis, or edema NEUROLOGIC:  Alert and oriented x 3, normal gait, CN II-XII grossly intact MENTAL STATUS:  appropriate BACK:  Non-tender to palpation, No CVAT SKIN:  Warm, dry, and intact  Results: U/A: "
